# Patient Record
Sex: Female | Born: 1985 | Race: Black or African American | Hispanic: No | Marital: Single | Smoking: Never smoker
Health system: Southern US, Community
[De-identification: ages and names within clinical notes are randomized; demographics above are authoritative.]

## PROBLEM LIST (undated history)

## (undated) DIAGNOSIS — I10 Essential (primary) hypertension: Secondary | ICD-10-CM

## (undated) DIAGNOSIS — J45909 Unspecified asthma, uncomplicated: Secondary | ICD-10-CM

---

## 2013-01-01 ENCOUNTER — Emergency Department (HOSPITAL_COMMUNITY)
Admission: EM | Admit: 2013-01-01 | Discharge: 2013-01-02 | Disposition: A | Discharge status: Home / Self Care | Payer: 59 | Attending: Emergency Medicine | Admitting: Emergency Medicine

## 2013-01-01 ENCOUNTER — Encounter (HOSPITAL_COMMUNITY): Payer: Self-pay | Admitting: *Deleted

## 2013-01-01 DIAGNOSIS — Z79899 Other long term (current) drug therapy: Secondary | ICD-10-CM | POA: Insufficient documentation

## 2013-01-01 DIAGNOSIS — J111 Influenza due to unidentified influenza virus with other respiratory manifestations: Secondary | ICD-10-CM

## 2013-01-01 DIAGNOSIS — IMO0001 Reserved for inherently not codable concepts without codable children: Secondary | ICD-10-CM | POA: Insufficient documentation

## 2013-01-01 DIAGNOSIS — J45909 Unspecified asthma, uncomplicated: Secondary | ICD-10-CM | POA: Insufficient documentation

## 2013-01-01 DIAGNOSIS — R5381 Other malaise: Secondary | ICD-10-CM | POA: Insufficient documentation

## 2013-01-01 DIAGNOSIS — R110 Nausea: Secondary | ICD-10-CM | POA: Insufficient documentation

## 2013-01-01 DIAGNOSIS — Z3202 Encounter for pregnancy test, result negative: Secondary | ICD-10-CM | POA: Insufficient documentation

## 2013-01-01 DIAGNOSIS — R51 Headache: Secondary | ICD-10-CM | POA: Insufficient documentation

## 2013-01-01 DIAGNOSIS — R05 Cough: Secondary | ICD-10-CM | POA: Insufficient documentation

## 2013-01-01 DIAGNOSIS — R059 Cough, unspecified: Secondary | ICD-10-CM | POA: Insufficient documentation

## 2013-01-01 DIAGNOSIS — J3489 Other specified disorders of nose and nasal sinuses: Secondary | ICD-10-CM | POA: Insufficient documentation

## 2013-01-01 DIAGNOSIS — I10 Essential (primary) hypertension: Secondary | ICD-10-CM | POA: Insufficient documentation

## 2013-01-01 DIAGNOSIS — J029 Acute pharyngitis, unspecified: Secondary | ICD-10-CM | POA: Insufficient documentation

## 2013-01-01 HISTORY — DX: Unspecified asthma, uncomplicated: J45.909

## 2013-01-01 HISTORY — DX: Essential (primary) hypertension: I10

## 2013-01-01 MED ORDER — ACETAMINOPHEN 325 MG PO TABS
650.0000 mg | ORAL_TABLET | Freq: Once | ORAL | Status: AC
  Administered 2013-01-01: 650 mg via ORAL
  Filled 2013-01-01: qty 2

## 2013-01-01 NOTE — ED Notes (Signed)
Pt c/o cough, sore throat, fever and headache starting this morning.

## 2013-01-02 LAB — POCT PREGNANCY, URINE: Preg Test, Ur: NEGATIVE

## 2013-01-02 MED ORDER — OSELTAMIVIR PHOSPHATE 75 MG PO CAPS: 75.0000 mg | ORAL_CAPSULE | Freq: Two times a day (BID) | ORAL | Status: DC

## 2013-01-02 MED ORDER — IBUPROFEN 800 MG PO TABS
800.0000 mg | ORAL_TABLET | Freq: Once | ORAL | Status: AC
  Administered 2013-01-02: 800 mg via ORAL
  Filled 2013-01-02: qty 1

## 2013-01-02 NOTE — ED Provider Notes (Signed)
Medical screening examination/treatment/procedure(s) were performed by non-physician practitioner and as supervising physician I was immediately available for consultation/collaboration.   Ariyon Gerstenberger M Imri Lor, MD 01/02/13 0427 

## 2013-01-02 NOTE — ED Provider Notes (Signed)
History     CSN: [id number]  Arrival date & time 01/01/13  2237   First MD Initiated Contact with Patient 01/02/13 0015      Chief Complaint  Patient presents with  . Fever  . Influenza   HPI  History provided by the patient. Patient is a 27 year old female with history of asthma and hypertension who presents with complaints of fever, chills, cough, sore throat and body aches. Symptoms first began this morning with slight headache and body aches symptoms. She is increasing cough throughout the day that is dry. Patient did take a dose of Tylenol at 2 PM. She is not using other treatments for symptoms. Denies any other aggravating or alleviating factors. Denies any other associated symptoms. Denies any vomiting or diarrhea.    Past Medical History  Diagnosis Date  . Asthma   . Hypertension     History reviewed. No pertinent past surgical history.  History reviewed. No pertinent family history.  History  Substance Use Topics  . Smoking status: Never Smoker   . Smokeless tobacco: Not on file  . Alcohol Use: Yes     Comment: occasionally    OB History    Grav Para Term Preterm Abortions TAB SAB Ect Mult Living                  Review of Systems  Constitutional: Positive for fever, chills, appetite change and fatigue.  HENT: Positive for sore throat and rhinorrhea. Negative for congestion and neck pain.   Respiratory: Positive for cough. Negative for shortness of breath.   Cardiovascular: Negative for chest pain.  Gastrointestinal: Positive for nausea. Negative for vomiting, abdominal pain, diarrhea and constipation.  Musculoskeletal: Positive for myalgias.  Neurological: Positive for headaches.  All other systems reviewed and are negative.    Allergies  Prednisone  Home Medications   Current Outpatient Rx  Name  Route  Sig  Dispense  Refill  . ALBUTEROL SULFATE HFA 108 (90 BASE) MCG/ACT IN AERS   Inhalation   Inhale 2 puffs into the lungs every 6 (six) hours  as needed.         . ETHYNODIOL DIAC-ETH ESTRADIOL 1-35 MG-MCG PO TABS   Oral   Take 1 tablet by mouth daily.         Marland Kitchen METOPROLOL SUCCINATE ER 100 MG PO TB24   Oral   Take 100 mg by mouth daily. Take with or immediately following a meal.         . NIFEDIPINE ER OSMOTIC 60 MG PO TB24   Oral   Take 60 mg by mouth 2 (two) times daily.         . TRIAMTERENE-HCTZ 75-50 MG PO TABS   Oral   Take 1 tablet by mouth daily.           BP 151/74  Pulse 116  Temp 101.4 F (38.6 C) (Oral)  Resp 20  SpO2 98%  LMP 12/08/2012  Physical Exam  Nursing note and vitals reviewed. Constitutional: She is oriented to person, place, and time. She appears well-developed and well-nourished. No distress.  HENT:  Head: Normocephalic.  Right Ear: Tympanic membrane normal.  Left Ear: Tympanic membrane normal.  Mouth/Throat: Oropharynx is clear and moist.  Neck: Normal range of motion. Neck supple.       No meningeal signs  Cardiovascular: Regular rhythm.  Tachycardia present.        Mild tachycardia  Pulmonary/Chest: Effort normal and breath sounds normal. No respiratory  distress. She has no wheezes. She has no rales. She exhibits no tenderness.  Abdominal: Soft. There is no tenderness. There is no rebound and no guarding.  Neurological: She is alert and oriented to person, place, and time.  Skin: Skin is warm and dry.  Psychiatric: She has a normal mood and affect. Her behavior is normal.    ED Course  Procedures   Labs Reviewed  POCT PREGNANCY, URINE     1. Influenza       MDM  Patient seen and evaluated. Patient with symptoms consistent with influenza. Symptoms began within the last 24 hours. Will give prescription for Tamiflu.  Patient slightly tachycardic. She refused IV fluids. Tachycardia most likely secondary to fever and infection. Symptoms are typical for her flu. Patient otherwise appears well and is stable for discharge home.      Angus Seller,  PA 01/02/13 0330

## 2014-08-07 ENCOUNTER — Other Ambulatory Visit: Payer: Self-pay | Admitting: Ophthalmology

## 2014-08-07 ENCOUNTER — Ambulatory Visit
Admission: RE | Admit: 2014-08-07 | Discharge: 2014-08-07 | Disposition: A | Discharge status: Home / Self Care | Payer: 59 | Source: Ambulatory Visit | Attending: Ophthalmology | Admitting: Ophthalmology

## 2014-08-07 DIAGNOSIS — H543 Unqualified visual loss, both eyes: Secondary | ICD-10-CM

## 2014-08-07 DIAGNOSIS — R51 Headache: Secondary | ICD-10-CM

## 2014-08-07 MED ORDER — IOHEXOL 300 MG/ML  SOLN
75.0000 mL | Freq: Once | INTRAMUSCULAR | Status: AC | PRN
  Administered 2014-08-07: 75 mL via INTRAVENOUS

## 2014-09-07 ENCOUNTER — Ambulatory Visit (INDEPENDENT_AMBULATORY_CARE_PROVIDER_SITE_OTHER): Payer: 59 | Admitting: Family Medicine

## 2014-09-07 ENCOUNTER — Encounter: Payer: Self-pay | Admitting: Family Medicine

## 2014-09-07 VITALS — BP 154/80 | HR 103 | Temp 99.1°F | Resp 16 | Ht 64.25 in | Wt 258.4 lb

## 2014-09-07 DIAGNOSIS — Z1321 Encounter for screening for nutritional disorder: Secondary | ICD-10-CM

## 2014-09-07 DIAGNOSIS — Z23 Encounter for immunization: Secondary | ICD-10-CM

## 2014-09-07 DIAGNOSIS — E669 Obesity, unspecified: Secondary | ICD-10-CM

## 2014-09-07 DIAGNOSIS — R51 Headache: Secondary | ICD-10-CM

## 2014-09-07 DIAGNOSIS — I10 Essential (primary) hypertension: Secondary | ICD-10-CM

## 2014-09-07 DIAGNOSIS — R0989 Other specified symptoms and signs involving the circulatory and respiratory systems: Secondary | ICD-10-CM

## 2014-09-07 DIAGNOSIS — Z130 Encounter for screening for diseases of the blood and blood-forming organs and certain disorders involving the immune mechanism: Secondary | ICD-10-CM

## 2014-09-07 DIAGNOSIS — R0609 Other forms of dyspnea: Secondary | ICD-10-CM

## 2014-09-07 DIAGNOSIS — Z13228 Encounter for screening for other metabolic disorders: Secondary | ICD-10-CM

## 2014-09-07 DIAGNOSIS — R0683 Snoring: Secondary | ICD-10-CM

## 2014-09-07 DIAGNOSIS — Z1329 Encounter for screening for other suspected endocrine disorder: Secondary | ICD-10-CM

## 2014-09-07 LAB — POCT GLYCOSYLATED HEMOGLOBIN (HGB A1C): HEMOGLOBIN A1C: 5.7

## 2014-09-07 LAB — GLUCOSE, POCT (MANUAL RESULT ENTRY): POC GLUCOSE: 122 mg/dL — AB (ref 70–99)

## 2014-09-07 NOTE — Progress Notes (Signed)
Subjective:    Patient ID: April Lynch, female    DOB: [birth year], 28 y.o.   MRN: [id number]  HPI  This 28 y.o. AA female is here to establish care. 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 She has had full vision evaluation in [place], [place] and a small abnormality at optic nerve was seen on fundoscopic exam. Pt was referred to  Dr. Frederico Hamman who ordered a CT Brain (normal- report reviewed and is in [facility]). Pt has been advised to see a neurologist, Dr. Michel Santee, and she requests referral.   Pt reports daily HAs for years. No relief w/ APAP and temporary relief w/ Excedrin. HA is located behind the eyes and is accompanied by nausea and mild vision disturbance. Pt is taking OCP (since age 73 for heavy menses) but thinks HA predates this medication. Pt states she is not currently sexually active. Family hx significant for younger sister having migraines.  Pt also has HTN, controlled on current medications. She has been evaluated by Cardiology while living in [place], [place].   Prior to Admission medications   Medication Sig Start Date End Date Taking? Authorizing Provider  albuterol (PROVENTIL HFA;VENTOLIN HFA) 108 (90 BASE) MCG/ACT inhaler Inhale 2 puffs into the lungs every 6 (six) hours as needed.   Yes Historical Provider, MD  ethynodiol-ethinyl estradiol Celso Sickle) 1-35 MG-MCG tablet Take 1 tablet by mouth daily.   Yes Historical Provider, MD  metoprolol succinate (TOPROL-XL) 100 MG 24 hr tablet Take 100 mg by mouth daily. Take with or immediately following a meal.   Yes Historical Provider, MD  NIFEdipine (PROCARDIA XL/ADALAT-CC) 90 MG 24 hr tablet Take 90 mg by mouth daily.   Yes Historical Provider, MD  triamterene-hydrochlorothiazide (MAXZIDE) 75-50 MG per tablet Take by mouth daily. Take 1/2 tablet by mouth daily.   Yes Historical Provider, MD    History   Social History  . Marital Status: Single    Spouse Name: N/A    Number of Children:  N/A  . Years of Education: N/A   Occupational History  . Not on file.   Social History Main Topics  . Smoking status: Never Smoker   . Smokeless tobacco: Not on file  . Alcohol Use: Yes     Comment: occasionally  . Drug Use: No  . Sexual Activity: Yes    Birth Control/ Protection: Pill   Other Topics Concern  . Not on file   Social History Narrative  . [employer] graduate w/ degree in chemistry; has returned to school at [employer] to pursue other studies.    Review of Systems  Constitutional: Negative.   Eyes: Positive for visual disturbance.  Respiratory: Negative for cough, chest tightness and shortness of breath.   Cardiovascular: Negative.   Gastrointestinal: Negative.   Endocrine: Negative.   Genitourinary: Positive for menstrual problem.  Musculoskeletal: Negative.   Skin: Negative.   Neurological: Positive for headaches. Negative for dizziness, tremors, seizures, syncope, facial asymmetry, speech difficulty, weakness and numbness.  Psychiatric/Behavioral: Positive for sleep disturbance.       Pt has been told she snores; her father has sleep apnea/on CPAP.       Objective:   Physical Exam  Nursing note and vitals reviewed. Constitutional: She is oriented to person, place, and time. Vital signs are normal. She appears well-developed and well-nourished. No distress.  HENT:  Head: Normocephalic and atraumatic.  Right Ear: Hearing, tympanic membrane, external ear and ear canal normal.  Left Ear: Hearing, tympanic  membrane, external ear and ear canal normal.  Nose: Nose normal. No nasal deformity or septal deviation.  Mouth/Throat: Uvula is midline, oropharynx is clear and moist and mucous membranes are normal. No oral lesions. Normal dentition. No dental caries.  Eyes: Conjunctivae, EOM and lids are normal. Pupils are equal, round, and reactive to light. No scleral icterus.  Neck: Trachea normal, normal range of motion, full passive range of motion without pain and phonation  normal. Neck supple. No spinous process tenderness and no muscular tenderness present. No mass and no thyromegaly present.  Cardiovascular: Normal rate, regular rhythm, S1 normal, S2 normal and normal pulses.   No extrasystoles are present. Exam reveals no gallop and no friction rub.   No murmur heard. Pulmonary/Chest: Effort normal and breath sounds normal. No respiratory distress. She has no decreased breath sounds. She has no wheezes.  Musculoskeletal:       Cervical back: Normal.       Thoracic back: Normal.       Lumbar back: Normal.  Major joints and muscle groups are unremarkble.  Lymphadenopathy:       Head (right side): No submental, no submandibular, no tonsillar, no preauricular, no posterior auricular and no occipital adenopathy present.       Head (left side): No submental, no submandibular, no tonsillar, no preauricular, no posterior auricular and no occipital adenopathy present.    She has no cervical adenopathy.       Right: No supraclavicular adenopathy present.       Left: No supraclavicular adenopathy present.  Neurological: She is alert and oriented to person, place, and time. She has normal strength and normal reflexes. She displays no atrophy and no tremor. No cranial nerve deficit or sensory deficit. She exhibits normal muscle tone. Coordination and gait normal.  Skin: Skin is warm, dry and intact. No ecchymosis and no rash noted. She is not diaphoretic. No cyanosis or erythema. Nails show no clubbing.  Nose has prominent allergic crease; infraorbital shadows/ venous congestion are present.  Psychiatric: She has a normal mood and affect. Her speech is normal and behavior is normal. Judgment and thought content normal. Cognition and memory are normal.    Results for orders placed in visit on 09/07/14  GLUCOSE, POCT (MANUAL RESULT ENTRY)      Result Value Ref Range   POC Glucose 122 (*) 70 - 99 mg/dl  POCT GLYCOSYLATED HEMOGLOBIN (HGB A1C)      Result Value Ref Range    Hemoglobin A1C 5.7         Assessment & Plan:  FYBOFBPZ(025.8) - Refer to Dr. Michel Santee. Advised to discontinue OCPs until evaluation is complete. Plan: POCT glucose (manual entry), POCT glycosylated hemoglobin (Hb A1C), CBC with Differential  Essential hypertension - Continue current medications (Nifedipine 90 mg 1 tablet daily, Metoprolol succinate 100 mg 1 tablet daily and Triam- HCTZ 75-50  1/2 tablet daily). Encouraged weight reduction and healthy nutrition. Plan: Lipid panel, COMPLETE METABOLIC PANEL WITH GFR, Thyroid Panel With TSH  Snores - Briefly discussed Sleep Study as part of HA work-up. Plan: COMPLETE METABOLIC PANEL WITH GFR  Obesity, unspecified - Refer to Nutritionist for counseling. Plan: Lipid panel, Thyroid Panel With TSH  Encounter for vitamin deficiency screening - Plan: Vit D  25 hydroxy  Need for prophylactic vaccination and inoculation against influenza - Plan: Flu Vaccine QUAD 36+ mos IM

## 2014-09-07 NOTE — Patient Instructions (Signed)
Your blood test shows that you do not have Diabetes. I have ordered a referral to Dr. Catalina Gravel per your request. Also you will be scheduled with a nutrition specialist.  I suggest you stop taking the oral contraceptive until your headache disorder has been evaluated. This may help lower your blood pressure as well.    I will see you again in 6 weeks. You will be notified about your labs next week.  We may have to consider a referral for sleep study to rule out sleep apnea.

## 2014-09-08 LAB — COMPLETE METABOLIC PANEL WITH GFR
ALBUMIN: 4 g/dL (ref 3.5–5.2)
ALK PHOS: 63 U/L (ref 39–117)
ALT: 20 U/L (ref 0–35)
AST: 12 U/L (ref 0–37)
BILIRUBIN TOTAL: 0.3 mg/dL (ref 0.2–1.2)
BUN: 16 mg/dL (ref 6–23)
CO2: 22 mEq/L (ref 19–32)
Calcium: 9.5 mg/dL (ref 8.4–10.5)
Chloride: 104 mEq/L (ref 96–112)
Creat: 0.66 mg/dL (ref 0.50–1.10)
GFR, Est African American: >89 mL/min
GLUCOSE: 100 mg/dL — AB (ref 70–99)
Potassium: 3.9 mEq/L (ref 3.5–5.3)
Sodium: 138 mEq/L (ref 135–145)
Total Protein: 8.2 g/dL (ref 6.0–8.3)

## 2014-09-08 LAB — CBC WITH DIFFERENTIAL/PLATELET
Basophils Absolute: 0 10*3/uL (ref 0.0–0.1)
Basophils Relative: 0 % (ref 0–1)
Eosinophils Absolute: 0.2 10*3/uL (ref 0.0–0.7)
Eosinophils Relative: 2 % (ref 0–5)
HCT: 42.5 % (ref 36.0–46.0)
HEMOGLOBIN: 14 g/dL (ref 12.0–15.0)
LYMPHS ABS: 3.9 10*3/uL (ref 0.7–4.0)
Lymphocytes Relative: 36 % (ref 12–46)
MCH: 25.2 pg — AB (ref 26.0–34.0)
MCHC: 32.9 g/dL (ref 30.0–36.0)
MCV: 76.4 fL — ABNORMAL LOW (ref 78.0–100.0)
MONOS PCT: 5 % (ref 3–12)
Monocytes Absolute: 0.5 10*3/uL (ref 0.1–1.0)
NEUTROS ABS: 6.1 10*3/uL (ref 1.7–7.7)
NEUTROS PCT: 57 % (ref 43–77)
Platelets: 437 10*3/uL — ABNORMAL HIGH (ref 150–400)
RBC: 5.56 MIL/uL — AB (ref 3.87–5.11)
RDW: 15.1 % (ref 11.5–15.5)
WBC: 10.7 10*3/uL — ABNORMAL HIGH (ref 4.0–10.5)

## 2014-09-08 LAB — LIPID PANEL
CHOL/HDL RATIO: 4.2 ratio
CHOLESTEROL: 182 mg/dL (ref 0–200)
HDL: 43 mg/dL (ref >39)
LDL Cholesterol: 110 mg/dL — ABNORMAL HIGH (ref 0–99)
Triglycerides: 147 mg/dL (ref <150)
VLDL: 29 mg/dL (ref 0–40)

## 2014-09-08 LAB — THYROID PANEL WITH TSH
FREE THYROXINE INDEX: 2.4 (ref 1.4–3.8)
T3 Uptake: 26 % (ref 22.0–35.0)
T4 TOTAL: 9.4 ug/dL (ref 4.5–12.0)
TSH: 0.621 u[IU]/mL (ref 0.350–4.500)

## 2014-09-08 LAB — VITAMIN D 25 HYDROXY (VIT D DEFICIENCY, FRACTURES): VIT D 25 HYDROXY: 11 ng/mL — AB (ref 30–89)

## 2014-09-09 ENCOUNTER — Encounter: Payer: Self-pay | Admitting: Family Medicine

## 2014-09-09 DIAGNOSIS — E669 Obesity, unspecified: Secondary | ICD-10-CM | POA: Insufficient documentation

## 2014-09-09 DIAGNOSIS — R0683 Snoring: Secondary | ICD-10-CM | POA: Insufficient documentation

## 2014-09-09 DIAGNOSIS — R519 Headache, unspecified: Secondary | ICD-10-CM | POA: Insufficient documentation

## 2014-09-09 DIAGNOSIS — R51 Headache: Secondary | ICD-10-CM | POA: Insufficient documentation

## 2014-09-09 DIAGNOSIS — I10 Essential (primary) hypertension: Secondary | ICD-10-CM | POA: Insufficient documentation

## 2014-09-12 ENCOUNTER — Encounter: Payer: Self-pay | Admitting: Radiology

## 2014-09-12 ENCOUNTER — Other Ambulatory Visit: Payer: Self-pay | Admitting: Family Medicine

## 2014-09-12 MED ORDER — ERGOCALCIFEROL 1.25 MG (50000 UT) PO CAPS: 50000.0000 [IU] | ORAL_CAPSULE | ORAL | Status: DC

## 2014-09-12 NOTE — Progress Notes (Signed)
Quick Note:  Please advise pt regarding following labs... The main abnormality on your labs is a very low Vitamin D. I am prescribing a Vitamin D supplement- 50000 units; take 1 capsule once a week for the next 3-4 months.  Thyroid tests are normal. Blood counts are within normal limits. Metabolic profile (salts in the blood, blood sugar, kidney and liver functions) are normal.  Copy to pt. ______

## 2014-09-24 ENCOUNTER — Telehealth: Payer: Self-pay

## 2014-09-24 NOTE — Telephone Encounter (Signed)
Patient requesting our office to refer her to Dr. Catalina Gravel @ the headache[facility] pain clinic to evaluate her constants headaches [phone]). Patients call back number is [phone]

## 2014-09-25 NOTE — Telephone Encounter (Signed)
That referral has been done; it appears to be closed as of 09/07/2014. Look under Referrals in Chart Review for details about this referral. It looks like pt had appt on 09/24/2014 at 2:10 PM. Was she not aware of this? She may need to call the office to reschedule.

## 2014-09-26 NOTE — Telephone Encounter (Signed)
Pt states she went to the appt but they refused to see her since the referral had not been completed. Pt states that she will call back to schedule an appt with the office but we need to get pt information to Dr. Ovid Curd office. They will not accept an "electronic request"

## 2014-09-27 NOTE — Telephone Encounter (Signed)
Calling dr lewit office to see exactly what they need to have this patient scheduled

## 2014-10-02 ENCOUNTER — Ambulatory Visit (INDEPENDENT_AMBULATORY_CARE_PROVIDER_SITE_OTHER): Payer: 59 | Admitting: Physician Assistant

## 2014-10-02 VITALS — BP 160/92 | HR 74 | Temp 99.4°F | Resp 16 | Ht 64.5 in | Wt 257.0 lb

## 2014-10-02 DIAGNOSIS — Z111 Encounter for screening for respiratory tuberculosis: Secondary | ICD-10-CM

## 2014-10-02 NOTE — Progress Notes (Signed)
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  Needs 2 step PPD.

## 2014-10-02 NOTE — Progress Notes (Signed)
  Tuberculosis Risk Questionnaire  1. Yes [place]: Were you born outside the [place] in one of the following parts of the world: [place], [place], [place], [place] or [place]?    2. Yes  Have you traveled outside the [place] and lived for more than one month in one of the following parts of the world: [place], [place], [place], [place] or [place]?    3. No Do you have a compromised immune system such as from any of the following conditions:HIV/AIDS, organ or bone marrow transplantation, diabetes, immunosuppressive medicines (e.g. Prednisone, Remicaide), leukemia, lymphoma, cancer of the head or neck, gastrectomy or jejunal bypass, end-stage renal disease (on dialysis), or silicosis?     4. Yes Have you ever or do you plan on working in: a residential care center, a health care facility, a jail or prison or homeless shelter?    5. No Have you ever: injected illegal drugs, used crack cocaine, lived in a homeless shelter  or been in jail or prison?     6. No Have you ever been exposed to anyone with infectious tuberculosis?    Tuberculosis Symptom Questionnaire  Do you currently have any of the following symptoms?  1. No Unexplained cough lasting more than 3 weeks?   2. No Unexplained fever lasting more than 3 weeks.   3. No Night Sweats (sweating that leaves the bedclothes and sheets wet)     4. No Shortness of Breath   5. No Chest Pain   6. No Unintentional weight loss    7. No Unexplained fatigue (very tired for no reason)

## 2014-10-04 ENCOUNTER — Ambulatory Visit (INDEPENDENT_AMBULATORY_CARE_PROVIDER_SITE_OTHER): Payer: 59

## 2014-10-04 DIAGNOSIS — Z7689 Persons encountering health services in other specified circumstances: Secondary | ICD-10-CM

## 2014-10-04 DIAGNOSIS — Z111 Encounter for screening for respiratory tuberculosis: Secondary | ICD-10-CM

## 2014-10-04 LAB — TB SKIN TEST
Induration: 0 mm
TB Skin Test: NEGATIVE

## 2014-10-04 NOTE — Progress Notes (Signed)
   Subjective:    Patient ID: April Lynch , female    DOB: 22-Oct-1986, 28 y.o.   MRN: [id number]  HPI Patient is a 28 yo female who presents today for a TB reading. Her results were negative. Patient received a printed copy of her results.     Review of Systems     Objective:   Physical Exam        Assessment & Plan:

## 2014-10-09 ENCOUNTER — Ambulatory Visit (INDEPENDENT_AMBULATORY_CARE_PROVIDER_SITE_OTHER): Payer: 59 | Admitting: Family Medicine

## 2014-10-09 VITALS — BP 122/84 | HR 70 | Temp 98.9°F | Resp 18 | Ht 64.5 in | Wt 253.0 lb

## 2014-10-09 DIAGNOSIS — Z111 Encounter for screening for respiratory tuberculosis: Secondary | ICD-10-CM

## 2014-10-09 NOTE — Progress Notes (Signed)
   Subjective:    Patient ID: April Lynch, female    DOB: Mar 31, 1986, 28 y.o.   MRN: [id number] This chart was scribed for Delman Cheadle, MD by Zola Button, Medical Scribe. This patient was seen in Room 11 and the patient's care was started at 1:57 PM.   HPI HPI Comments: April Lynch is a 28 y.o. female who presents to the [facility] for her second PPD shot. She denies fever, chills, cough, SOB and wheezing. She is currently in school for surgery tech.  Past Medical History  Diagnosis Date  . Asthma   . Hypertension    Current Outpatient Prescriptions on File Prior to Visit  Medication Sig Dispense Refill  . albuterol (PROVENTIL HFA;VENTOLIN HFA) 108 (90 BASE) MCG/ACT inhaler Inhale 2 puffs into the lungs every 6 (six) hours as needed.      . ergocalciferol (DRISDOL) 50000 UNITS capsule Take 1 capsule (50,000 Units total) by mouth once a week.  4 capsule  5  . metoprolol succinate (TOPROL-XL) 100 MG 24 hr tablet Take 100 mg by mouth daily. Take with or immediately following a meal.      . NIFEdipine (PROCARDIA XL/ADALAT-CC) 90 MG 24 hr tablet Take 90 mg by mouth daily.      Marland Kitchen triamterene-hydrochlorothiazide (MAXZIDE) 75-50 MG per tablet Take by mouth daily. Take 1/2 tablet by mouth daily.       No current facility-administered medications on file prior to visit.   Allergies  Allergen Reactions  . Prednisone Hives and Swelling     Review of Systems  Constitutional: Negative for fever, chills, appetite change and fatigue.  HENT: Negative for congestion, ear discharge and sinus pressure.   Eyes: Negative for discharge.  Respiratory: Negative for cough, shortness of breath and wheezing.   Cardiovascular: Negative for chest pain.  Gastrointestinal: Negative for abdominal pain and diarrhea.  Genitourinary: Negative for frequency and hematuria.  Musculoskeletal: Negative for back pain.  Skin: Negative for rash.  Neurological: Negative for seizures and headaches.    Psychiatric/Behavioral: Negative for hallucinations.       Objective:  BP 122/84  Pulse 70  Temp(Src) 98.9 F (37.2 C) (Oral)  Resp 18  Ht 5' 4.5" (1.638 m)  Wt 253 lb (114.76 kg)  BMI 42.77 kg/m2  SpO2 98%  LMP 10/02/2014  Physical Exam  Nursing note and vitals reviewed. Constitutional: She is oriented to person, place, and time. She appears well-developed and well-nourished. No distress.  HENT:  Head: Normocephalic and atraumatic.  Mouth/Throat: Oropharynx is clear and moist. No oropharyngeal exudate.  Eyes: Pupils are equal, round, and reactive to light.  Neck: Neck supple.  Cardiovascular: Normal rate.   Pulmonary/Chest: Effort normal.  Musculoskeletal: She exhibits no edema.  Neurological: She is alert and oriented to person, place, and time. No cranial nerve deficit.  Skin: Skin is warm and dry. No rash noted.  Psychiatric: She has a normal mood and affect. Her behavior is normal.          Assessment & Plan:  Second PPD placed, to return in 48-72 hours for follow-up. Screening-pulmonary TB - Plan: TB Skin Test   I personally performed the services described in this documentation, which was scribed in my presence. The recorded information has been reviewed and considered, and addended by me as needed.  Delman Cheadle, MD MPH

## 2014-10-09 NOTE — Progress Notes (Signed)
  Tuberculosis Risk Questionnaire  1. Yes  Were you born outside the [place] in one of the following parts of the world: [place], [place], [place], [place] or [place]?    2. Yes  Have you traveled outside the [place] and lived for more than one month in one of the following parts of the world: [place], [place], [place], [place] or [place]?    3. No Do you have a compromised immune system such as from any of the following conditions:HIV/AIDS, organ or bone marrow transplantation, diabetes, immunosuppressive medicines (e.g. Prednisone, Remicaide), leukemia, lymphoma, cancer of the head or neck, gastrectomy or jejunal bypass, end-stage renal disease (on dialysis), or silicosis?     4. Yes  Have you ever or do you plan on working in: a residential care center, a health care facility, a jail or prison or homeless shelter?    5. No Have you ever: injected illegal drugs, used crack cocaine, lived in a homeless shelter  or been in jail or prison?     6. No Have you ever been exposed to anyone with infectious tuberculosis?    Tuberculosis Symptom Questionnaire  Do you currently have any of the following symptoms?  1. No Unexplained cough lasting more than 3 weeks?   2. No Unexplained fever lasting more than 3 weeks.   3. No Night Sweats (sweating that leaves the bedclothes and sheets wet)     4. No Shortness of Breath   5. No Chest Pain   6. No Unintentional weight loss    7. No Unexplained fatigue (very tired for no reason)

## 2014-10-11 ENCOUNTER — Ambulatory Visit (INDEPENDENT_AMBULATORY_CARE_PROVIDER_SITE_OTHER): Payer: 59 | Admitting: *Deleted

## 2014-10-11 DIAGNOSIS — Z111 Encounter for screening for respiratory tuberculosis: Secondary | ICD-10-CM

## 2014-10-11 DIAGNOSIS — Z7689 Persons encountering health services in other specified circumstances: Secondary | ICD-10-CM

## 2014-10-11 LAB — TB SKIN TEST
INDURATION: 0 mm
TB Skin Test: NEGATIVE

## 2014-10-19 ENCOUNTER — Other Ambulatory Visit: Payer: Self-pay | Admitting: Family Medicine

## 2014-10-19 ENCOUNTER — Ambulatory Visit (INDEPENDENT_AMBULATORY_CARE_PROVIDER_SITE_OTHER): Payer: 59 | Admitting: Family Medicine

## 2014-10-19 ENCOUNTER — Telehealth: Payer: Self-pay | Admitting: Radiology

## 2014-10-19 ENCOUNTER — Encounter: Payer: Self-pay | Admitting: Family Medicine

## 2014-10-19 VITALS — BP 120/80 | HR 85 | Temp 99.3°F | Resp 16 | Ht 64.0 in | Wt 255.0 lb

## 2014-10-19 DIAGNOSIS — N920 Excessive and frequent menstruation with regular cycle: Secondary | ICD-10-CM

## 2014-10-19 DIAGNOSIS — I10 Essential (primary) hypertension: Secondary | ICD-10-CM

## 2014-10-19 DIAGNOSIS — J309 Allergic rhinitis, unspecified: Secondary | ICD-10-CM

## 2014-10-19 MED ORDER — FEXOFENADINE HCL 180 MG PO TABS: 180.0000 mg | ORAL_TABLET | Freq: Every day | ORAL | Status: DC

## 2014-10-19 NOTE — Patient Instructions (Addendum)
You need to call Dr. Ovid Curd office to schedule your Neurology appointment for further evaluation of headaches.  I have ordered a pelvic ultrasound to evaluate four female reproductive organs. Because of your weight, they may need to do an intravaginal ultrasound (if you do not have a problem with that). If you would rather not have the intravaginal imaging study, they will try to get the best picture possible but it will be of low quality. I will contact you once I have the results of the scan.  I do not feel comfortable prescribing any other contraceptive until I have this information.  Continue taking current medications and work on weight loss. If you can get close to a normal weight, your menstrual cycle might become more normal.      Mediterranean Diet  Why follow it? Research shows.   Those who follow the Mediterranean diet have a reduced risk of heart disease    The diet is associated with a reduced incidence of Parkinson's and Alzheimer's diseases   People following the diet may have longer life expectancies and lower rates of chronic diseases    The Dietary Guidelines for Americans recommends the Mediterranean diet as an eating plan to promote health and prevent disease  What Is the Mediterranean Diet?    Healthy eating plan based on typical foods and recipes of Mediterranean-style cooking   The diet is primarily a plant based diet; these foods should make up a majority of meals   Starches - Plant based foods should make up a majority of meals - They are an important sources of vitamins, minerals, energy, antioxidants, and fiber - Choose whole grains, foods high in fiber and minimally processed items  - Typical grain sources include wheat, oats, barley, corn, brown rice, bulgar, farro, millet, polenta, couscous  - Various types of beans include chickpeas, lentils, fava beans, black beans, white beans   Fruits  Veggies - Large quantities of antioxidant rich fruits & veggies; 6 or more  servings  - Vegetables can be eaten raw or lightly drizzled with oil and cooked  - Vegetables common to the traditional Mediterranean Diet include: artichokes, arugula, beets, broccoli, brussel sprouts, cabbage, carrots, celery, collard greens, cucumbers, eggplant, kale, leeks, lemons, lettuce, mushrooms, okra, onions, peas, peppers, potatoes, pumpkin, radishes, rutabaga, shallots, spinach, sweet potatoes, turnips, zucchini - Fruits common to the Mediterranean Diet include: apples, apricots, avocados, cherries, clementines, dates, figs, grapefruits, grapes, melons, nectarines, oranges, peaches, pears, pomegranates, strawberries, tangerines  Fats - Replace butter and margarine with healthy oils, such as olive oil, canola oil, and tahini  - Limit nuts to no more than a handful a day  - Nuts include walnuts, almonds, pecans, pistachios, pine nuts  - Limit or avoid candied, honey roasted or heavily salted nuts - Olives are central to the [employer] - can be eaten whole or used in a variety of dishes   Meats Protein - Limiting red meat: no more than a few times a month - When eating red meat: choose lean cuts and keep the portion to the size of deck of cards - Eggs: approx. 0 to 4 times a week  - Fish and lean poultry: at least 2 a week  - Healthy protein sources include, chicken, [place], lean beef, lamb - Increase intake of seafood such as tuna, salmon, trout, mackerel, shrimp, scallops - Avoid or limit high fat processed meats such as sausage and bacon  Dairy - Include moderate amounts of low fat dairy products  -  Focus on healthy dairy such as fat free yogurt, skim milk, low or reduced fat cheese - Limit dairy products higher in fat such as whole or 2% milk, cheese, ice cream  Alcohol - Moderate amounts of red wine is ok  - No more than 5 oz daily for women (all ages) and men older than age 9  - No more than 10 oz of wine daily for men younger than 63  Other - Limit sweets and other  desserts  - Use herbs and spices instead of salt to flavor foods  - Herbs and spices common to the traditional Mediterranean Diet include: basil, bay leaves, chives, cloves, cumin, fennel, garlic, lavender, marjoram, mint, oregano, parsley, pepper, rosemary, sage, savory, sumac, tarragon, thyme   It's not just a diet, it's a lifestyle:    The Mediterranean diet includes lifestyle factors typical of those in the region    Foods, drinks and meals are best eaten with others and savored   Daily physical activity is important for overall good health   This could be strenuous exercise like running and aerobics   This could also be more leisurely activities such as walking, housework, yard-work, or taking the stairs   Moderation is the key; a balanced and healthy diet accommodates most foods and drinks   Consider portion sizes and frequency of consumption of certain foods   Meal Ideas & Options:    Breakfast:  o Whole wheat toast or whole wheat English muffins with peanut butter & hard boiled egg o Steel cut oats topped with apples & cinnamon and skim milk  o Fresh fruit: banana, strawberries, melon, berries, peaches  o Smoothies: strawberries, bananas, greek yogurt, peanut butter o Low fat greek yogurt with blueberries and granola  o Egg white omelet with spinach and mushrooms o Breakfast couscous: whole wheat couscous, apricots, skim milk, cranberries    Sandwiches:  o Hummus and grilled vegetables (peppers, zucchini, squash) on whole wheat bread   o Grilled chicken on whole wheat pita with lettuce, tomatoes, cucumbers or tzatziki  o Tuna salad on whole wheat bread: tuna salad made with greek yogurt, olives, red peppers, capers, green onions o Garlic rosemary lamb pita: lamb sauted with garlic, rosemary, salt & pepper; add lettuce, cucumber, greek yogurt to pita - flavor with lemon juice and black pepper    Seafood:  o Mediterranean grilled salmon, seasoned with garlic, basil, parsley, lemon  juice and black pepper o Shrimp, lemon, and spinach whole-grain pasta salad made with low fat greek yogurt  o Seared scallops with lemon orzo  o Seared tuna steaks seasoned salt, pepper, coriander topped with tomato mixture of olives, tomatoes, olive oil, minced garlic, parsley, green onions and cappers    Meats:  o Herbed greek chicken salad with kalamata olives, cucumber, feta  o Red bell peppers stuffed with spinach, bulgur, lean ground beef (or lentils) & topped with feta   o Kebabs: skewers of chicken, tomatoes, onions, zucchini, squash  o [place] burgers: made with red onions, mint, dill, lemon juice, feta cheese topped with roasted red peppers   Vegetarian o Cucumber salad: cucumbers, artichoke hearts, celery, red onion, feta cheese, tossed in olive oil & lemon juice  o Hummus and whole grain pita points with a greek salad (lettuce, tomato, feta, olives, cucumbers, red onion) o Lentil soup with celery, carrots made with vegetable broth, garlic, salt and pepper  o Tabouli salad: parsley, bulgur, mint, scallions, cucumbers, tomato, radishes, lemon juice, olive oil, salt and  pepper. o

## 2014-10-19 NOTE — Telephone Encounter (Signed)
Message copied by Candice Camp on Fri Oct 19, 2014  3:45 PM ------      Message from: Barton Fanny      Created: Fri Oct 19, 2014 12:33 PM       Please let pt know that I prescribed a different allergy medication (generic Allegra 180 mg  1 tablet every evening). It was routed to her pharmacy; I am not sure of the co-pay for this medication since it is available OTC. If it costs more than she wants to pay, she can let us know. She could ask the pharmacist what her insurance will cover; that information would help [place] a lot.            Thank you. ------

## 2014-10-19 NOTE — Telephone Encounter (Signed)
Thanks called her to advise/ she voiced understanding.

## 2014-10-22 ENCOUNTER — Encounter: Payer: Self-pay | Admitting: Dietician

## 2014-10-22 ENCOUNTER — Encounter: Payer: 59 | Attending: Family Medicine | Admitting: Dietician

## 2014-10-22 VITALS — Ht 64.0 in | Wt 258.3 lb

## 2014-10-22 DIAGNOSIS — E669 Obesity, unspecified: Secondary | ICD-10-CM | POA: Diagnosis not present

## 2014-10-22 DIAGNOSIS — Z713 Dietary counseling and surveillance: Secondary | ICD-10-CM | POA: Insufficient documentation

## 2014-10-22 DIAGNOSIS — Z6841 Body Mass Index (BMI) 40.0 and over, adult: Secondary | ICD-10-CM | POA: Insufficient documentation

## 2014-10-22 NOTE — Patient Instructions (Addendum)
Try to eat 3 times a day and snacks if you are hungry.  In the late morning, try to have some eat (choose protein and carb from the snack sheet) or a protein shake (protein shake). Use small plates for meals. Fill half of your plate with vegetables and have protein the size of the palm of your hand. Drink mostly water or other no calorie beverages. Think about keeping cut up vegetables and hummus around for snacks/meal. Aim to exercise 3-4 x week.

## 2014-10-22 NOTE — Progress Notes (Signed)
  Medical Nutrition Therapy:  Appt start time: 1430 end time:  7124.   Assessment:  Primary concerns today: April Lynch is here today since she has struggled with her weight since she was younger and her doctor is concerned. States that she does not eat badly. Has gained about 30 lbs in the past 2 years. Has a lot of stress since she is in school for surgical technology and already has a degree in chemistry. 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   Lives by herself. Eats about 1-2 x day. Does not eat out much. Has been eating the same way for awhile. Has tried some diets in the past that don't normally last too long. Feels like she gets sick if she eats in the morning. Will vomit if she tries to eat or drink in the morning.   Has been trying to avoid salt since she has blood pressure but she has trouble doing that.  Gets about 6-7 hours of sleep per night.    Preferred Learning Style:   No preference indicated   Learning Readiness:   Contemplating  MEDICATIONS: see list   DIETARY INTAKE:  Usual eating pattern includes 1-2 meals and 0snacks per day.  Avoided foods include: fruit, seafood, peanut butter, yogurt, popcorn, "sweets"   24-hr recall:  B ( AM): none Snk ( AM): none  L (3 PM): leftovers such as chicken with vegetables and water or juice Snk ( PM): none D (8-9 PM): chicken with vegetables  Snk ( PM): none Beverages: water, juice, soda at work sometimes  Usual physical activity: sometimes will go to the gym (1 x week) and will work out for 60 minutes (elliptical, treadmill, and weights)  Estimated energy needs: 2000 calories 225 g carbohydrates 150 g protein 56 g fat  Progress Towards Goal(s):  In progress.   Nutritional Diagnosis:  [place]-3.3 Overweight/obesity As related to hx of meal skipping and large portion sizes.  As evidenced by BMI of 44.3.    Intervention:  Nutrition counseling provided. Plan: Try to eat 3 times a day and snacks if you are  hungry.  In the late morning, try to have some eat (choose protein and carb from the snack sheet) or a protein shake (protein shake). Use small plates for meals. Fill half of your plate with vegetables and have protein the size of the palm of your hand. Drink mostly water or other no calorie beverages. Think about keeping cut up vegetables and hummus around for snacks/meal. Aim to exercise 3-4 x week.  Teaching Method Utilized:  Visual Auditory Hands on  Handouts given during visit include:  MyPlate Handout  15 g CHO Snacks  Barriers to learning/adherence to lifestyle change: fees sick if she eats in the morning and doesn't like a lot of foods  Demonstrated degree of understanding via:  Teach Back   Monitoring/Evaluation:  Dietary intake, exercise, and body weight in 2 month(s).

## 2014-10-23 NOTE — Progress Notes (Signed)
Subjective:    Patient ID: April Lynch, female    DOB: [birth year], 28 y.o.   MRN: [id number]  Headache  Pertinent negatives include no ear pain, eye redness or sinus pressure.    This 28 y.o. AA female is here for follow-up re: abnormal menses; she has hx of heavy menstrual bleeding which was controlled w/ OCPs. Pt advised by me to discontinue OCPs due to headache disorder as well as elevated BP. Since stopping that medication, BP is in normal range and she has experienced less HAs. Pt is awaiting appt w/ neurologist, Dr. Catalina Gravel foe full evaluation of headaches, which are c/w a migraine variant; CT Head in August 2015 was normal.  Regarding abnormal menses- pt desires some other oral agent to control her bleeding. She declines Depo injection or IUD.  She has not had pelvic ultrasound as part of evaluation for menorrhagia.  Pt has sinus congestion, mild sore throat, rhinorrhea and hx of seasonal allergies, treated w/ OTC Claritin. This medication has been minimally effective. She denies fever/chills, fatigue, sneezing, chronic cough, SOB, chest tightness, HA or dizziness.   Patient Active Problem List   Diagnosis Date Noted  . Allergic rhinitis 10/19/2014  . Menorrhagia with regular cycle 10/19/2014  . Headache(784.0) 09/09/2014  . Essential hypertension 09/09/2014  . Snores 09/09/2014  . Obesity, unspecified 09/09/2014    Prior to Admission medications   Medication Sig Start Date End Date Taking? Authorizing Provider  albuterol (PROVENTIL HFA;VENTOLIN HFA) 108 (90 BASE) MCG/ACT inhaler Inhale 2 puffs into the lungs every 6 (six) hours as needed.   Yes Historical Provider, MD  ergocalciferol (DRISDOL) 50000 UNITS capsule Take 1 capsule (50,000 Units total) by mouth once a week. 09/12/14 09/12/15 Yes Barton Fanny, MD  metoprolol succinate (TOPROL-XL) 100 MG 24 hr tablet Take 100 mg by mouth daily. Take with or immediately following a meal.   Yes Historical Provider, MD  NIFEdipine  (PROCARDIA XL/ADALAT-CC) 90 MG 24 hr tablet Take 90 mg by mouth daily.   Yes Historical Provider, MD  triamterene-hydrochlorothiazide (MAXZIDE) 75-50 MG per tablet Take by mouth daily. Take 1/2 tablet by mouth daily.   Yes Historical Provider, MD  fexofenadine (ALLEGRA) 180 MG tablet Take 1 tablet (180 mg total) by mouth daily. 10/19/14   Barton Fanny, MD    History   Social History  . Marital Status: Single    Spouse Name: N/A    Number of Children: N/A  . Years of Education: N/A   Occupational History  . Not on file.   Social History Main Topics  . Smoking status: Never Smoker   . Smokeless tobacco: Not on file  . Alcohol Use: Yes     Comment: occasionally  . Drug Use: No  . Sexual Activity: Yes    Birth Control/ Protection: Pill   Other Topics Concern  . Not on file   Social History Narrative  . No narrative on file    Review of Systems  Constitutional: Negative.   HENT: Negative for ear pain, sinus pressure, sneezing and trouble swallowing.   Eyes: Negative for redness and itching.  Respiratory: Negative.   Cardiovascular: Negative.   Skin: Negative.   Neurological: Positive for headaches.      Objective:   Physical Exam  Nursing note and vitals reviewed. Constitutional: She is oriented to person, place, and time. She appears well-developed and well-nourished. No distress.  HENT:  Head: Normocephalic and atraumatic.  Right Ear: External ear normal.  Left Ear:  External ear normal.  Nose: Nose normal.  Mouth/Throat: Oropharynx is clear and moist.  Eyes: Conjunctivae and EOM are normal. Pupils are equal, round, and reactive to light. No scleral icterus.  Neck: Normal range of motion. Neck supple. No thyromegaly present.  Cardiovascular: Normal rate, regular rhythm and normal heart sounds.  Exam reveals no gallop and no friction rub.   No murmur heard. Pulmonary/Chest: Effort normal and breath sounds normal. No respiratory distress. She has no wheezes.    Musculoskeletal: Normal range of motion. She exhibits no edema and no tenderness.       Cervical back: Normal.       Thoracic back: Normal.  Lymphadenopathy:    She has no cervical adenopathy.  Neurological: She is alert and oriented to person, place, and time. She has normal reflexes. No cranial nerve deficit. She exhibits normal muscle tone. Coordination normal.  Skin: Skin is warm and dry. No rash noted. She is not diaphoretic. No erythema.  Psychiatric: She has a normal mood and affect. Her behavior is normal. Judgment and thought content normal.       Assessment & Plan:  Menorrhagia with regular cycle - Advised that she remain off OCPs for now and she should have evaluation re: anatomy. Plan: US Pelvis Complete  Essential hypertension- Stable and controlled on current medications.  Allergic rhinitis, unspecified allergic rhinitis type- Trial generic Allegra.  Meds ordered this encounter  Medications  . fexofenadine (ALLEGRA) 180 MG tablet    Sig: Take 1 tablet (180 mg total) by mouth daily.    Dispense:  30 tablet    Refill:  5

## 2014-10-30 ENCOUNTER — Ambulatory Visit
Admission: RE | Admit: 2014-10-30 | Discharge: 2014-10-30 | Disposition: A | Discharge status: Home / Self Care | Payer: 59 | Source: Ambulatory Visit | Attending: Family Medicine | Admitting: Family Medicine

## 2014-10-30 DIAGNOSIS — N920 Excessive and frequent menstruation with regular cycle: Secondary | ICD-10-CM

## 2014-11-01 ENCOUNTER — Telehealth: Payer: Self-pay | Admitting: Family Medicine

## 2014-11-01 DIAGNOSIS — D259 Leiomyoma of uterus, unspecified: Secondary | ICD-10-CM

## 2014-11-01 DIAGNOSIS — N920 Excessive and frequent menstruation with regular cycle: Secondary | ICD-10-CM

## 2014-11-01 NOTE — Telephone Encounter (Signed)
I phoned pt to advise her of prlvic US findings- multiple uterine fibroids. Pt has appt w/ Dr. Ardath Sax (PQA) in [place], [place] on 11/12/2014. She wonders if she needs a referral from [place]; I advised her I would send a message to referral pool about this. I am going to order a referral so that her scheduled visit will be covered by her insurer [facility]). A copy of the [place] report will be at 104 front desk for her to pick up to take to GYN visit.

## 2014-12-17 ENCOUNTER — Ambulatory Visit: Payer: 59 | Admitting: Dietician

## 2015-04-11 ENCOUNTER — Ambulatory Visit (INDEPENDENT_AMBULATORY_CARE_PROVIDER_SITE_OTHER): Payer: 59 | Admitting: Family Medicine

## 2015-04-11 ENCOUNTER — Encounter: Payer: Self-pay | Admitting: Family Medicine

## 2015-04-11 VITALS — BP 138/73 | HR 73 | Temp 98.7°F | Resp 16 | Ht 64.0 in | Wt 261.8 lb

## 2015-04-11 DIAGNOSIS — E669 Obesity, unspecified: Secondary | ICD-10-CM | POA: Diagnosis not present

## 2015-04-11 DIAGNOSIS — E559 Vitamin D deficiency, unspecified: Secondary | ICD-10-CM

## 2015-04-11 DIAGNOSIS — I10 Essential (primary) hypertension: Secondary | ICD-10-CM | POA: Diagnosis not present

## 2015-04-11 DIAGNOSIS — D259 Leiomyoma of uterus, unspecified: Secondary | ICD-10-CM

## 2015-04-11 MED ORDER — ERGOCALCIFEROL 1.25 MG (50000 UT) PO CAPS: 50000.0000 [IU] | ORAL_CAPSULE | ORAL | Status: DC

## 2015-04-11 NOTE — Patient Instructions (Addendum)
Vitamin D Deficiency Vitamin D is an important vitamin that your body needs. Having too little of it in your body is called a deficiency. A very bad deficiency can make your bones soft and can cause a condition called rickets.  Vitamin D is important to your body for different reasons, such as:   It helps your body absorb 2 minerals called calcium and phosphorus.  It helps make your bones healthy.  It may prevent some diseases, such as diabetes and multiple sclerosis.  It helps your muscles and heart. You can get vitamin D in several ways. It is a natural part of some foods. The vitamin is also added to some dairy products and cereals. Some people take vitamin D supplements. Also, your body makes vitamin D when you are in the sun. It changes the sun's rays into a form of the vitamin that your body can use. CAUSES   Not eating enough foods that contain vitamin D.  Not getting enough sunlight.  Having certain digestive system diseases that make it hard to absorb vitamin D. These diseases include Crohn's disease, chronic pancreatitis, and cystic fibrosis.  Having a surgery in which part of the stomach or small intestine is removed.  Being obese. Fat cells pull vitamin D out of your blood. That means that obese people may not have enough vitamin D left in their blood and in other body tissues.  Having chronic kidney or liver disease. RISK FACTORS Risk factors are things that make you more likely to develop a vitamin D deficiency. They include:  Being older.  Not being able to get outside very much.  Living in a nursing home.  Having had broken bones.  Having weak or thin bones (osteoporosis).  Having a disease or condition that changes how your body absorbs vitamin D.  Having dark skin.  Some medicines such as seizure medicines or steroids.  Being overweight or obese. SYMPTOMS Mild cases of vitamin D deficiency may not have any symptoms. If you have a very bad case, symptoms  may include:  Bone pain.  Muscle pain.  Falling often.  Broken bones caused by a minor injury, due to osteoporosis. DIAGNOSIS A blood test is the best way to tell if you have a vitamin D deficiency. TREATMENT Vitamin D deficiency can be treated in different ways. Treatment for vitamin D deficiency depends on what is causing it. Options include:  Taking vitamin D supplements.  Taking a calcium supplement. Your caregiver will suggest what dose is best for you. HOME CARE INSTRUCTIONS  Take any supplements that your caregiver prescribes. Follow the directions carefully. Take only the suggested amount.  Have your blood tested 2 months after you start taking supplements.  Eat foods that contain vitamin D. Healthy choices include:  Fortified dairy products, cereals, or juices. Fortified means vitamin D has been added to the food. Check the label on the package to be sure.  Fatty fish like salmon or trout.  Eggs.  Oysters.  Do not use a tanning bed.  Keep your weight at a healthy level. Lose weight if you need to.  Keep all follow-up appointments. Your caregiver will need to perform blood tests to make sure your vitamin D deficiency is going away. SEEK MEDICAL CARE IF:  You have any questions about your treatment.  You continue to have symptoms of vitamin D deficiency.  You have nausea or vomiting.  You are constipated.  You feel confused.  You have severe abdominal or back pain. MAKE   SURE YOU:  Understand these instructions.  Will watch your condition.  Will get help right away if you are not doing well or get worse. Document Released: 03/07/2012 Document Revised: 04/10/2013 Document Reviewed: 03/07/2012 [facility] Patient Information 2015 [place], [place]. This information is not intended to replace advice given to you by your health care provider. Make sure you discuss any questions you have with your health care provider.   Your referral to your GYN physician in  [place] has been ordered.  Pick up the Vitamin D and take 1 capsule every week until you have no refills. Then, you will need to get an over-the-counter Vitamin D3  2000 units p[er capsule and take it daily. Try to get some sun exposure most days of the week so that your body can use the Vitamin D.   Take care and congratulations on your upcoming graduation. All the best to you!

## 2015-04-13 NOTE — Progress Notes (Signed)
   Subjective:    Patient ID: April Lynch, female    DOB: 05-16-1986, 29 y.o.   MRN: [id number]  HPI  This 29 y.o. Female is here for follow-up re: HTN and obesity. She recently started fitness plan but has not lost weight. She does state that her clothes fit differently. Current medication- triamterene- HCTZ 75-50 mg  1/2 tab daily is effective w/o adverse effects. She is not taking nifedipine 90 mg tab nor metoprolol succ 100 mg.  Pt has made some nutrition changes, limiting salt, caffeine and carbs. Pt is in school; she goes to [employer] at 4 AM most mornings for hour-long work-out.  Pt has uterine fibroids, followed by GYN in [place], [place]. Last OV w/ GYN ~ 3 months ago. Pt needs referral back to same specialist. Next appt will be to monitor growths (at least 4 masses) and to determine treatment. Goal is to preserve child-bearing capacity.  Pt has Vit D def (11 ng/mL in Sept 2015) but currently not taking Vit D supplement.    Patient Active Problem List   Diagnosis Date Noted  . Allergic rhinitis 10/19/2014  . Menorrhagia with regular cycle 10/19/2014  . Headache(784.0) 09/09/2014  . Essential hypertension 09/09/2014  . Snores 09/09/2014  . Obesity 09/09/2014    MEDICATIONS, SURG,SOC and FAM HX reviewed.   Review of Systems  Constitutional: Positive for activity change. Negative for fever, diaphoresis and fatigue.  Eyes: Negative for visual disturbance.  Respiratory: Negative for cough, chest tightness, shortness of breath and wheezing.   Cardiovascular: Negative.   Genitourinary: Positive for menstrual problem.  Musculoskeletal: Negative.   Neurological: Negative for dizziness, syncope, weakness, numbness and headaches.  Psychiatric/Behavioral: Negative.        Objective:   Physical Exam  Constitutional: She is oriented to person, place, and time. She appears well-developed and well-nourished. No distress.  HENT:  Head: Normocephalic and atraumatic.  Right Ear:  External ear normal.  Left Ear: External ear normal.  Nose: Nose normal.  Mouth/Throat: Oropharynx is clear and moist.  Eyes: Conjunctivae and EOM are normal. No scleral icterus.  Neck: Normal range of motion. Neck supple. No thyromegaly present.  Cardiovascular: Normal rate, regular rhythm and normal heart sounds.   No murmur heard. Pulmonary/Chest: Effort normal and breath sounds normal. No respiratory distress. She has no wheezes.  Musculoskeletal: Normal range of motion. She exhibits no edema or tenderness.  Neurological: She is alert and oriented to person, place, and time. No cranial nerve deficit. Coordination normal.  Skin: Skin is warm and dry. She is not diaphoretic.  Psychiatric: She has a normal mood and affect. Her behavior is normal. Judgment and thought content normal.  Nursing note and vitals reviewed.      Assessment & Plan:  Uterine leiomyoma, unspecified location - Plan: Ambulatory referral to Gynecology  Essential hypertension- Well controlled on diuretic only. Continue current lifestyle modifications w/ nutrition changes and fitness routine.  Vitamin D deficiency- Resume Vit D 50000 units once a week until no refills remain. Reminded pt that she should then get OTC Vit D 2000 units and take this supplement daily along w/ routine sun exposure.  Obesity- Encouraged commitment to getting healthier. Pt is seeing some positive benefits.  Meds ordered this encounter  Medications  . ergocalciferol (DRISDOL) 50000 UNITS capsule    Sig: Take 1 capsule (50,000 Units total) by mouth once a week.    Dispense:  4 capsule    Refill:  7

## 2015-04-28 IMAGING — US US TRANSVAGINAL NON-OB
1 series · 14 of 25 positions shown · non-contrast
Comparison: None

CLINICAL DATA: Menorrhagia



[Series 1: us transvaginal non-ob · 0.23mm/px · 14 of 78 slices shown]
[im 1/78]
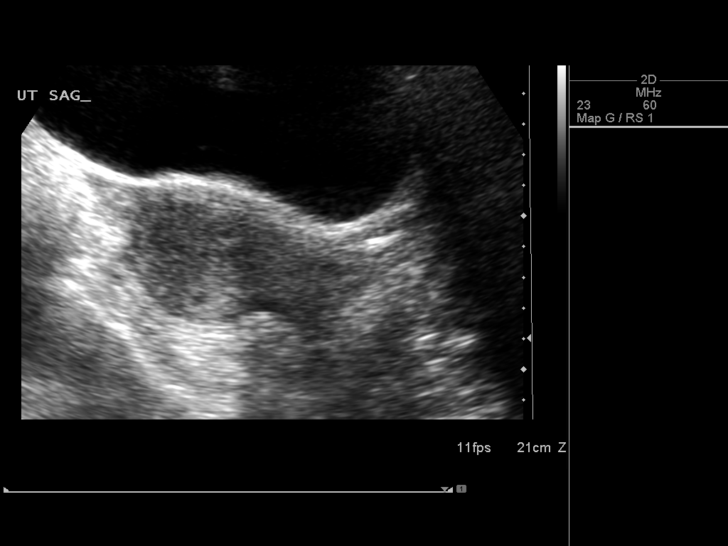
[im 7/78]
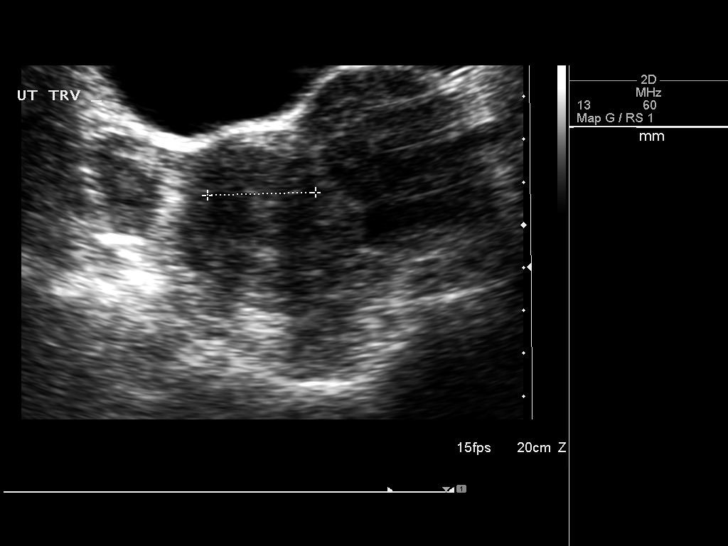
[im 13/78]
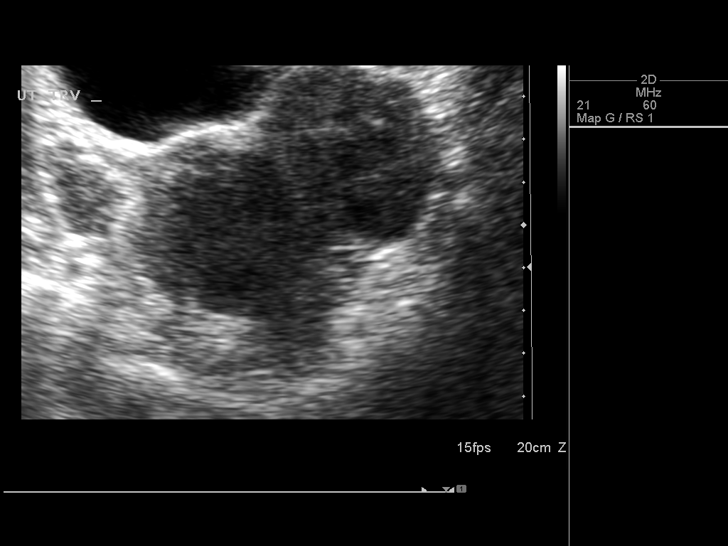
[im 20/78]
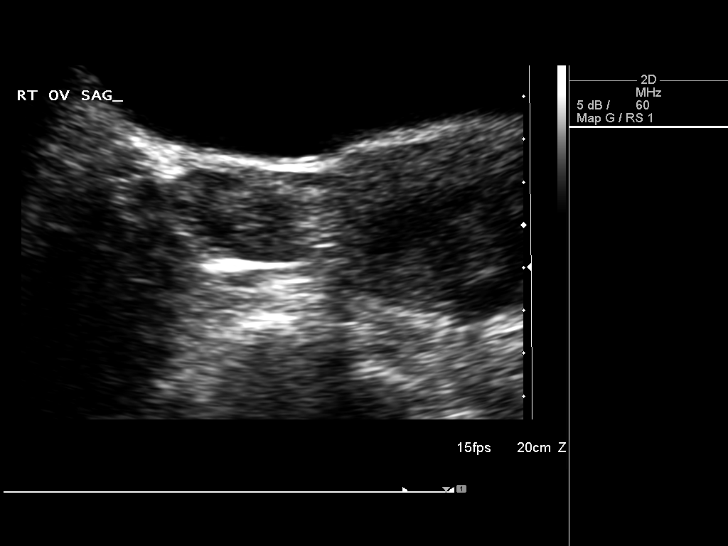
[im 26/78]
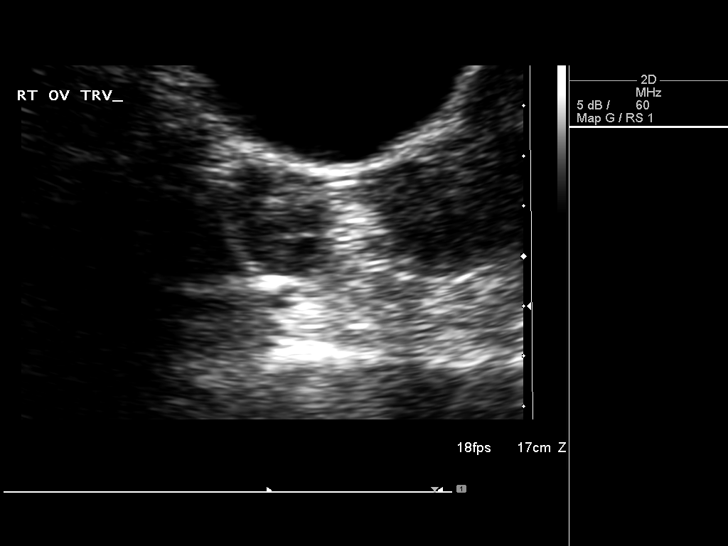
[im 29/78]
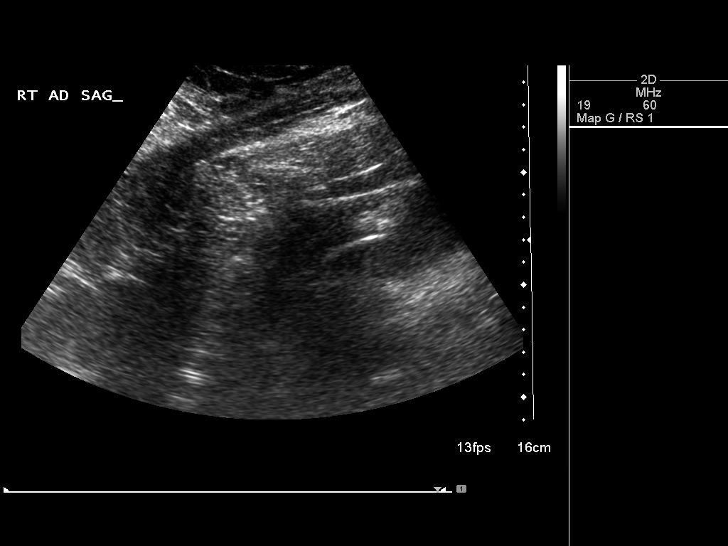
[im 36/78]
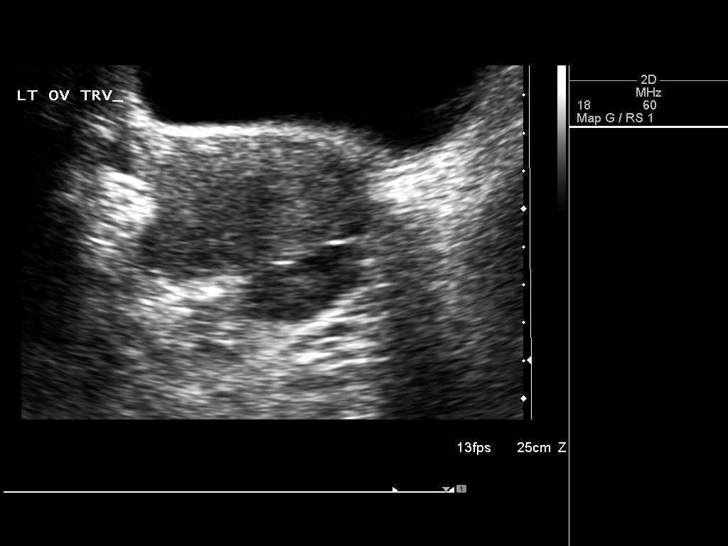
[im 42/78]
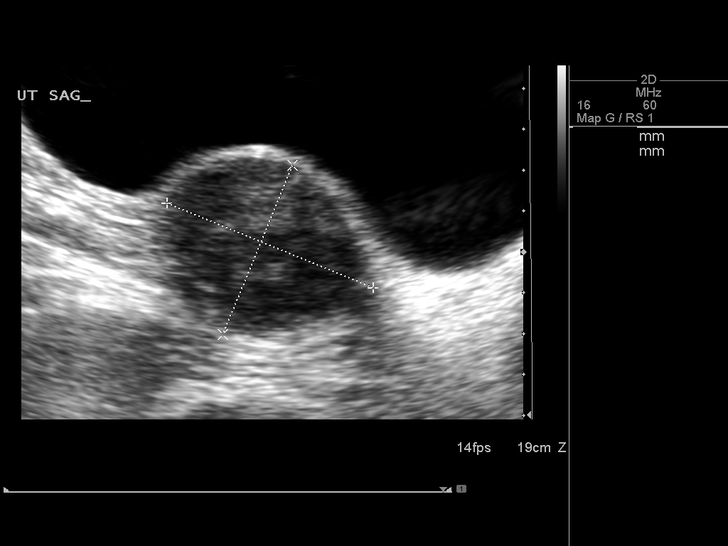
[im 49/78]
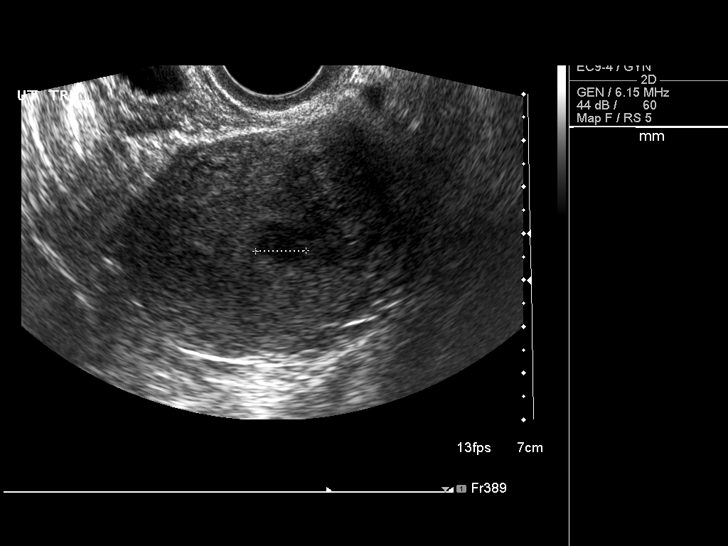
[im 52/78]
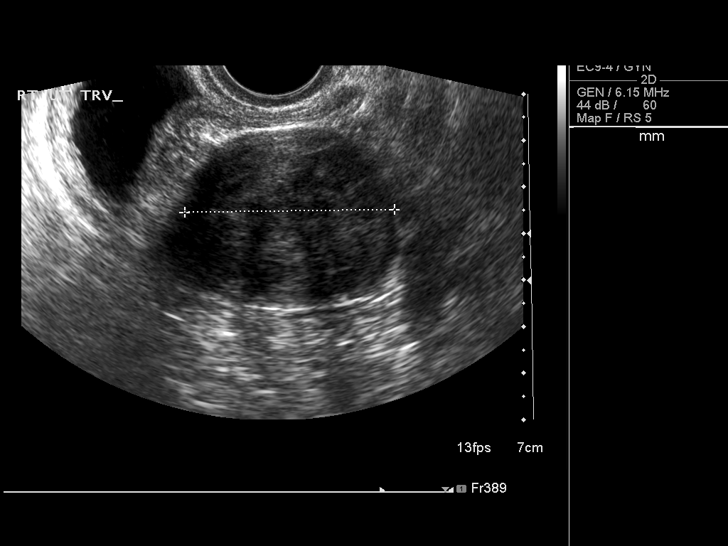
[im 58/78]
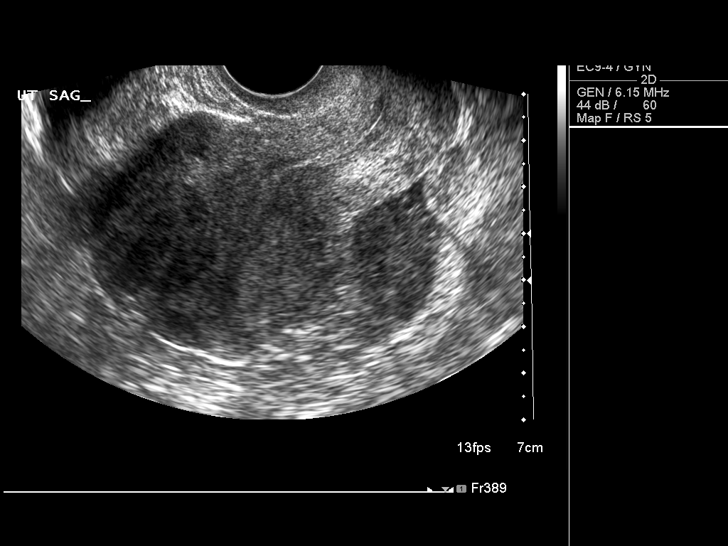
[im 65/78]
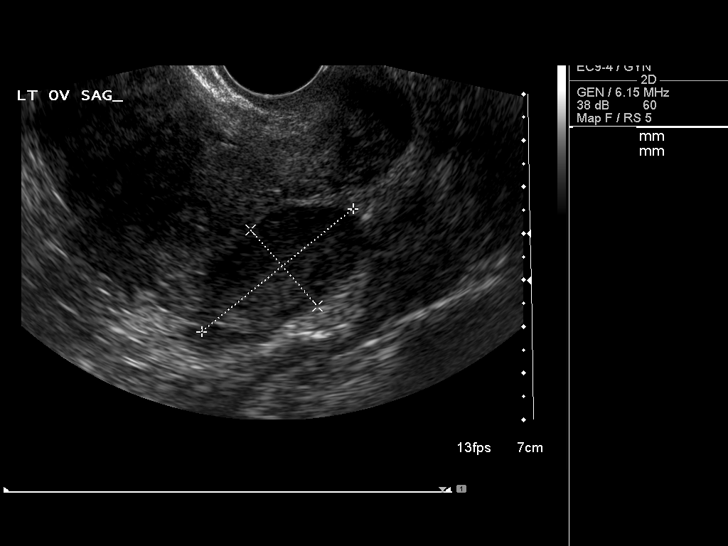
[im 71/78]
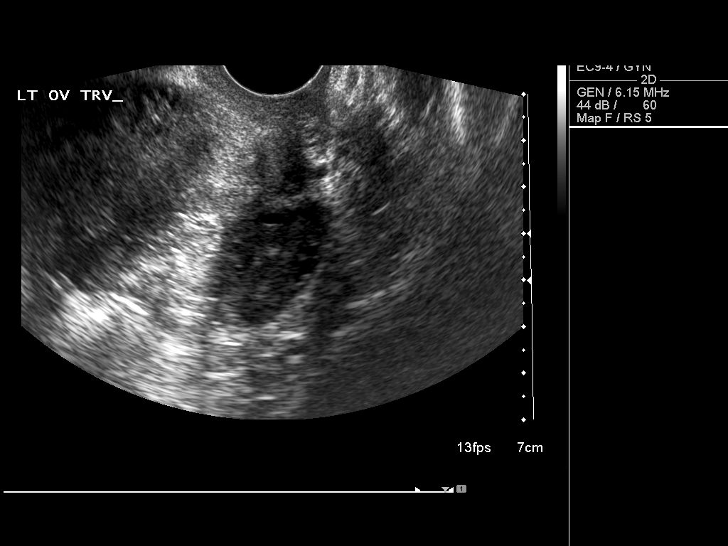
[im 78/78]
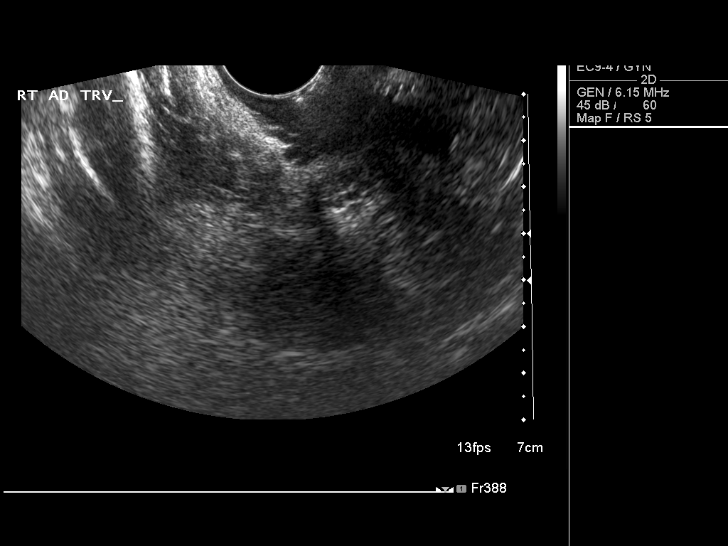

[14 of 25 positions shown; findings below may reference images not displayed]

FINDINGS: Uterus

Measurements: 3.5 x 4.9 x 5.3 cm. Multiple uterine fibroids,
including:

--4.2 x 4.0 x 4.5 cm pedunculated fibroid in the anterior uterine
fundus

--2.2 x 2.6 x 2.4 cm subserosal fibroid in the posterior uterine
fundus

--1.1 x 1.1 x 1.1 cm submucosal fibroid in the posterior uterine
body

Endometrium

Thickness: 10 mm.  No focal abnormality visualized.

Right ovary

Measurements: 3.6 x 2.1 x 2.0 cm. Normal appearance/no adnexal mass.

Left ovary

Measurements: 4.2 x 2.2 x 2.2 cm. Normal appearance/no adnexal mass.

Other findings

No free fluid.
IMPRESSION: Multiple uterine fibroids, measuring up to 4.5 cm, as above.

## 2016-04-01 ENCOUNTER — Other Ambulatory Visit: Payer: Self-pay | Admitting: Ophthalmology

## 2016-04-01 DIAGNOSIS — H4710 Unspecified papilledema: Secondary | ICD-10-CM

## 2016-04-07 ENCOUNTER — Other Ambulatory Visit (HOSPITAL_COMMUNITY)
Admission: RE | Admit: 2016-04-07 | Discharge: 2016-04-07 | Disposition: A | Discharge status: Home / Self Care | Payer: PRIVATE HEALTH INSURANCE | Source: Ambulatory Visit | Attending: Ophthalmology | Admitting: Ophthalmology

## 2016-04-07 ENCOUNTER — Other Ambulatory Visit: Payer: Self-pay | Admitting: Ophthalmology

## 2016-04-07 ENCOUNTER — Ambulatory Visit
Admission: RE | Admit: 2016-04-07 | Discharge: 2016-04-07 | Disposition: A | Discharge status: Home / Self Care | Payer: PRIVATE HEALTH INSURANCE | Source: Ambulatory Visit | Attending: Ophthalmology | Admitting: Ophthalmology

## 2016-04-07 DIAGNOSIS — H4710 Unspecified papilledema: Secondary | ICD-10-CM

## 2016-04-07 LAB — GRAM STAIN
GRAM STAIN: NONE SEEN
Gram Stain: NONE SEEN

## 2016-04-07 NOTE — Discharge Instructions (Signed)

## 2016-04-13 ENCOUNTER — Telehealth: Payer: Self-pay | Admitting: Radiology

## 2016-04-13 NOTE — Telephone Encounter (Signed)
Pt spoke with Dr. Jola Baptist over the weekend about headache. Called her this am and explained she needed to talk to Dr. Frederico Hamman about what he wants to do re: headache. Explained blood patch and that bedrest and fluid will take care of the headache. Pt states she is seeing Dr. Frederico Hamman this afternoon.  She will have office call or she will call if further follow up is needed.

## 2016-04-20 ENCOUNTER — Encounter: Payer: Self-pay | Admitting: Neurology

## 2016-04-20 ENCOUNTER — Ambulatory Visit (INDEPENDENT_AMBULATORY_CARE_PROVIDER_SITE_OTHER): Payer: PRIVATE HEALTH INSURANCE | Admitting: Neurology

## 2016-04-20 VITALS — BP 132/84 | HR 76 | Resp 16 | Ht 64.0 in | Wt 243.0 lb

## 2016-04-20 DIAGNOSIS — R51 Headache: Secondary | ICD-10-CM

## 2016-04-20 DIAGNOSIS — R0683 Snoring: Secondary | ICD-10-CM

## 2016-04-20 DIAGNOSIS — G932 Benign intracranial hypertension: Secondary | ICD-10-CM | POA: Diagnosis not present

## 2016-04-20 DIAGNOSIS — R519 Headache, unspecified: Secondary | ICD-10-CM

## 2016-04-20 DIAGNOSIS — G4710 Hypersomnia, unspecified: Secondary | ICD-10-CM | POA: Diagnosis not present

## 2016-04-20 NOTE — Progress Notes (Signed)
Subjective:    Patient ID: April Lynch is a 30 y.o. female.  HPI     Star Age, MD, PhD [facility] [address] [place], [place] [address]  Dear Dr. Frederico Hamman,  I saw your patient, April Lynch, upon your kind request in my neurologic clinic today for initial consultation of her pseudotumor cerebri and concern for underlying obstructive sleep apnea. The patient is unaccompanied today. As you know, [name] is a 30 year old right-handed woman with an underlying medical history of morbid obesity, vitamin D deficiency, hypertension, uterine leiomyoma, and astigmatism, who has had recurrent headaches. She had evidence of papilledema bilaterally. I reviewed your office note from 04/13/2016, which you kindly included. According to your notes, she had a lumbar puncture recently about 4 weeks ago which showed an elevated opening pressure of 30. I reviewed, puncture note from 04/07/2016 which showed an opening pressure of 30 cm and a closing pressure of 18 cm. She has been started on Diamox, currently 500 mg twice daily. She seems to tolerate this well. Visual acuity corrected is 20/40 on the right and 20/20 on the left, she had no obvious visual field deficits. Recent eye exam showed no edema of the optic disks. Of note, she snores and reports excessive daytime somnolence and morning headaches.  She lives alone, but family has reported snoring to her.  Of note, she had a CTH w/wo contrast on 08/07/14: IMPRESSION: Normal CT appearance of the brain.  She is trying to lose weight and has indeed lost about 17 lb in the last 2 months.  She has a FHx of OSA in her father, who uses a CPAP machine.  She has improved in her HAs, and has occasional morning. She has occasional nocturia.  She goes to bed around 8:30 to 9 PM.  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 She does not typically wake up rested. She feels tired  during the day. She does not typically drink caffeine. She drinks alcohol very occasionally, maybe once a month, she does not smoke cigarettes. She lives alone and is single. She does not have any children. She denies restless leg symptoms. She denies nocturia on a nightly basis. She had wisdom tooth removal today. Has not taken pain medication.  She has been able to tolerate the Diamox, currently 500 mg bid.   Her Past Medical History Is Significant For: Past Medical History  Diagnosis Date  . Asthma   . Hypertension     Her Past Surgical History Is Significant For: No past surgical history on file.  Her Family History Is Significant For: Family History  Problem Relation Age of Onset  . Heart murmur Mother   . Diabetes Father   . Gout Father     Her Social History Is Significant For: Social History   Social History  . Marital Status: Single    Spouse Name: N/A  . Number of Children: 0  . Years of Education: BA   Occupational History  . [employer]    Social History Main Topics  . Smoking status: Never Smoker   . Smokeless tobacco: None  . Alcohol Use: 0.0 oz/week    0 Standard drinks or equivalent per week     Comment: occasionally  . Drug Use: No  . Sexual Activity: Yes    Birth Control/ Protection: Pill   Other Topics Concern  . None   Social History Narrative  Rare caffeine use     Her Allergies Are:  Allergies  Allergen Reactions  . Prednisone Hives and Swelling  :   Her Current Medications Are:  Outpatient Encounter Prescriptions as of 04/20/2016  Medication Sig  . acetaZOLAMIDE (DIAMOX) 500 MG capsule Take 500 mg by mouth 2 (two) times daily.  Marland Kitchen albuterol (PROVENTIL HFA;VENTOLIN HFA) 108 (90 BASE) MCG/ACT inhaler Inhale 2 puffs into the lungs every 6 (six) hours as needed.  Marland Kitchen lisinopril-hydrochlorothiazide (PRINZIDE,ZESTORETIC) 20-25 MG tablet Take 1 tablet by mouth daily.  Marland Kitchen loratadine (CLARITIN) 10 MG tablet Take by mouth.   No facility-administered  encounter medications on file as of 04/20/2016.  :   Review of Systems:  Out of a complete 14 point review of systems, all are reviewed and negative with the exception of these symptoms as listed below:   Review of Systems  Neurological:       Patient reports that she has had headaches and blurred vision for years.   Psychiatric/Behavioral:       Racing thoughts    Objective:  Neurologic Exam  Physical Exam Physical Examination:   Filed Vitals:   04/20/16 1445  BP: 132/84  Pulse: 76  Resp: 16   General Examination: The patient is a very pleasant 30 y.o. female in no acute distress. She appears well-developed and well-nourished and well groomed. She is morbidly obese.   HEENT: Normocephalic, atraumatic, pupils are equal, round and reactive to light and accommodation. Funduscopic exam is normal with sharp disc margins noted. Extraocular tracking is good without limitation to gaze excursion or nystagmus noted. 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 Normal smooth pursuit is noted. Hearing is grossly intact. Face is symmetric with normal facial animation and normal facial sensation. Speech is clear with no dysarthria noted. There is no hypophonia. There is no lip, neck/head, jaw or voice tremor. Neck is supple with full range of passive and active motion. There are no carotid bruits on auscultation. Oropharynx exam reveals: mild mouth dryness, adequate dental hygiene and moderate airway crowding, due to smaller airway entry, tonsils of 2+, larger appearing uvula. Mallampati is class II. Tongue protrudes centrally and palate elevates symmetrically. Tonsils are 2+ in size. Neck size is 15.25 inches. She has a Mild overbite.   Chest: Clear to auscultation without wheezing, rhonchi or crackles noted.  Heart: S1+S2+0, regular and normal without murmurs, rubs or gallops noted.   Abdomen: Soft, non-tender and non-distended  with normal bowel sounds appreciated on auscultation.  Extremities: There is no pitting edema in the distal lower extremities bilaterally. Pedal pulses are intact.  Skin: Warm and dry without trophic changes noted.  Musculoskeletal: exam reveals no obvious joint deformities, tenderness or joint swelling or erythema.   Neurologically:  Mental status: The patient is awake, alert and oriented in all 4 spheres. Her immediate and remote memory, attention, language skills and fund of knowledge are appropriate. There is no evidence of aphasia, agnosia, apraxia or anomia. Speech is clear with normal prosody and enunciation. Thought process is linear. Mood is normal and affect is normal.  Cranial nerves II - XII are as described above under HEENT exam. In addition: shoulder shrug is normal with equal shoulder height noted. Motor exam: Normal bulk, strength and tone is noted. There is no drift, tremor or rebound. Romberg is negative. Reflexes are 2+ throughout. Babinski: Toes are flexor bilaterally. Fine motor skills and coordination: intact  with normal finger taps, normal hand movements, normal rapid alternating patting, normal foot taps and normal foot agility.  Cerebellar testing: No dysmetria or intention tremor on finger to nose testing. Heel to shin is unremarkable bilaterally. There is no truncal or gait ataxia.  Sensory exam: intact to light touch, pinprick, vibration, temperature sense in the upper and lower extremities.  Gait, station and balance: She stands easily. No veering to one side is noted. No leaning to one side is noted. Posture is age-appropriate and stance is narrow based. Gait shows normal stride length and normal pace. No problems turning are noted. Tandem walk is slightly difficult for her.                Assessment and Plan:  In summary, Trionna Serrao is a very pleasant 30 y.o.-year old female with an underlying medical history of morbid obesity, vitamin D deficiency, hypertension,  uterine leiomyoma, and astigmatism, who has recently been diagnosed with idiopathic intracranial hypertension. She had a lumbar puncture earlier this month which confirmed an increased opening pressure of 30 cm. She has had some improvement in her headaches and has established treatment with Diamox which she has been able to tolerate. She still has some blurry vision but no clear-cut evidence of papilledema at this time on my exam. She had a fairly good eye exam most recently with your office. She is advised that she also may be at risk of obstructive sleep apnea in light of her obesity, family history of OSA and crowded airway. I had a long chat with the patient about my findings and the diagnosis of pseudotumor cerebri, and OSA, the prognosis and treatment options. We talked about medical treatments, surgical interventions and non-pharmacological approaches. I explained in particular the risks and ramifications of untreated moderate to severe OSA, especially with respect to developing cardiovascular disease down the Road, including congestive heart failure, difficult to treat hypertension, cardiac arrhythmias, or stroke. Even type 2 diabetes has, in part, been linked to untreated OSA. Symptoms of untreated OSA include daytime sleepiness, memory problems, mood irritability and mood disorder such as depression and anxiety, lack of energy, as well as recurrent headaches, especially morning headaches. We talked about trying to maintain a healthy lifestyle in general, as well as the importance of weight control. I encouraged the patient to eat healthy, exercise daily and keep well hydrated, to keep a scheduled bedtime and wake time routine, to not skip any meals and eat healthy snacks in between meals. I advised the patient not to drive when feeling sleepy. I recommended the following at this time: MRI brain without contrast, MRV brain with and without contrast, if possible as she has multiple piercings most of which  she cannot remove as I understand. In addition, I suggested we proceed with a sleep study with potential positive airway pressure titration. (We will score hypopneas at 3% and split the sleep study into diagnostic and treatment portion, if the estimated. 2 hour AHI is >15/h).   She is advised to continue with Diamox at the current dose.   I explained the sleep test procedure to the patient and also outlined possible surgical and non-surgical treatment options of OSA, including the use of a custom-made dental device (which would require a referral to a specialist dentist or oral surgeon), upper airway surgical options, such as pillar implants, radiofrequency surgery, tongue base surgery, and UPPP (which would involve a referral to an ENT surgeon). Rarely, jaw surgery such as mandibular advancement may be considered.  I also explained the CPAP treatment option to the patient, who indicated that she would be willing to try CPAP if the need arises. I explained the importance of being compliant with PAP treatment, not only for insurance purposes but primarily to improve Her symptoms, and for the patient's long term health benefit, including to reduce Her cardiovascular risks. I answered all her questions today and the patient was in agreement. I would like to see her back after the studies are completed and we will update her over the phone in the interim as well.    Thank you very much for allowing me to participate in the care of this nice patient. If I can be of any further assistance to you please do not hesitate to call me at [phone].  Sincerely,   Star Age, MD, PhD

## 2016-04-20 NOTE — Patient Instructions (Addendum)
We will do a brain scan, called MRI and MRV (for brain venous blood vessels) and call you with the test results. We will have to schedule you for this on a separate date. This test requires authorization from your insurance, and we will take care of the insurance process.  Based on your symptoms and your exam I believe you are at risk for obstructive sleep apnea or OSA, and I think we should proceed with a sleep study to determine whether you do or do not have OSA and how severe it is. If you have more than mild OSA, I want you to consider treatment with CPAP. Please remember, the risks and ramifications of moderate to severe obstructive sleep apnea or OSA are: Cardiovascular disease, including congestive heart failure, stroke, difficult to control hypertension, arrhythmias, and even type 2 diabetes has been linked to untreated OSA. Sleep apnea causes disruption of sleep and sleep deprivation in most cases, which, in turn, can cause recurrent headaches, problems with memory, mood, concentration, focus, and vigilance. Most people with untreated sleep apnea report excessive daytime sleepiness, which can affect their ability to drive. Please do not drive if you feel sleepy.   I will likely see you back after your sleep study to go over the test results and where to go from there. We will call you after your sleep study to advise about the results (most likely, you will hear from Beverlee Nims, my nurse) and to set up an appointment at the time, as necessary.    Our sleep lab administrative assistant, Arrie Aran will meet with you or call you to schedule your sleep study. If you don't hear back from her by next week please feel free to call her at [phone]. This is her direct line and please leave a message with your phone number to call back if you get the voicemail box. She will call back as soon as possible.   You can continue with the Diamox at the current dose.

## 2016-04-22 ENCOUNTER — Ambulatory Visit: Payer: PRIVATE HEALTH INSURANCE | Admitting: Neurology

## 2016-05-04 ENCOUNTER — Other Ambulatory Visit: Payer: Self-pay | Admitting: Obstetrics and Gynecology

## 2016-05-04 DIAGNOSIS — N926 Irregular menstruation, unspecified: Secondary | ICD-10-CM

## 2016-05-13 ENCOUNTER — Ambulatory Visit
Admission: RE | Admit: 2016-05-13 | Discharge: 2016-05-13 | Disposition: A | Discharge status: Home / Self Care | Payer: PRIVATE HEALTH INSURANCE | Source: Ambulatory Visit | Attending: Neurology | Admitting: Neurology

## 2016-05-13 ENCOUNTER — Ambulatory Visit
Admission: RE | Admit: 2016-05-13 | Discharge: 2016-05-13 | Disposition: A | Discharge status: Home / Self Care | Payer: PRIVATE HEALTH INSURANCE | Source: Ambulatory Visit | Attending: Obstetrics and Gynecology | Admitting: Obstetrics and Gynecology

## 2016-05-13 ENCOUNTER — Telehealth: Payer: Self-pay | Admitting: *Deleted

## 2016-05-13 ENCOUNTER — Telehealth: Payer: Self-pay | Admitting: Neurology

## 2016-05-13 DIAGNOSIS — R519 Headache, unspecified: Secondary | ICD-10-CM

## 2016-05-13 DIAGNOSIS — R0683 Snoring: Secondary | ICD-10-CM

## 2016-05-13 DIAGNOSIS — R51 Headache: Secondary | ICD-10-CM

## 2016-05-13 DIAGNOSIS — G4710 Hypersomnia, unspecified: Secondary | ICD-10-CM

## 2016-05-13 DIAGNOSIS — N926 Irregular menstruation, unspecified: Secondary | ICD-10-CM

## 2016-05-13 DIAGNOSIS — G932 Benign intracranial hypertension: Secondary | ICD-10-CM

## 2016-05-13 NOTE — Telephone Encounter (Signed)
LAUREN/[facility] PA:[id number] Shalece Bey [phone] * [phone] 89 NEEDS TO SPEAK SOMEONE ABOUT MRI THEY CAN NOT DO Y

## 2016-05-13 NOTE — Telephone Encounter (Signed)
How would you like to proceed? 

## 2016-05-13 NOTE — Telephone Encounter (Signed)
Will order head CT w/wo contrast.

## 2016-05-13 NOTE — Telephone Encounter (Signed)
I spoke to [facility] and advised them that we are sending orders for CT scan

## 2016-05-13 NOTE — Telephone Encounter (Signed)
Pt called said she just left GI but could not have MRI today due to piercings in each ear which she could not take out. She was advised to call by GI to find out what other recommendations the provider would have.

## 2016-05-15 ENCOUNTER — Telehealth: Payer: Self-pay | Admitting: *Deleted

## 2016-05-15 NOTE — Telephone Encounter (Signed)
April Lynch             CID [id number]  Patient SAME                 Pt's Dr Rexene Alberts        Area Code 910 Phone# [phone] * DOB Jan 31, 1986      RE GOT 2 PIERCINGS OUT/CAN DO MRI INSTEAD OF CT      PCB AND LET HER KNOW                                 Disp:Y/N N If Y = C/B If No Response In 66minutes ============================================================

## 2016-05-21 ENCOUNTER — Emergency Department (HOSPITAL_BASED_OUTPATIENT_CLINIC_OR_DEPARTMENT_OTHER): Payer: PRIVATE HEALTH INSURANCE

## 2016-05-21 ENCOUNTER — Emergency Department (HOSPITAL_BASED_OUTPATIENT_CLINIC_OR_DEPARTMENT_OTHER)
Admission: EM | Admit: 2016-05-21 | Discharge: 2016-05-22 | Disposition: A | Discharge status: Home / Self Care | Payer: PRIVATE HEALTH INSURANCE | Attending: Emergency Medicine | Admitting: Emergency Medicine

## 2016-05-21 ENCOUNTER — Encounter (HOSPITAL_BASED_OUTPATIENT_CLINIC_OR_DEPARTMENT_OTHER): Payer: Self-pay

## 2016-05-21 DIAGNOSIS — R109 Unspecified abdominal pain: Secondary | ICD-10-CM

## 2016-05-21 DIAGNOSIS — Z79899 Other long term (current) drug therapy: Secondary | ICD-10-CM | POA: Diagnosis not present

## 2016-05-21 DIAGNOSIS — I10 Essential (primary) hypertension: Secondary | ICD-10-CM | POA: Diagnosis not present

## 2016-05-21 DIAGNOSIS — J45909 Unspecified asthma, uncomplicated: Secondary | ICD-10-CM | POA: Insufficient documentation

## 2016-05-21 DIAGNOSIS — R1084 Generalized abdominal pain: Secondary | ICD-10-CM | POA: Diagnosis present

## 2016-05-21 DIAGNOSIS — K529 Noninfective gastroenteritis and colitis, unspecified: Secondary | ICD-10-CM | POA: Diagnosis not present

## 2016-05-21 LAB — COMPREHENSIVE METABOLIC PANEL
ALK PHOS: 52 U/L (ref 38–126)
ALT: 23 U/L (ref 14–54)
ANION GAP: 9 (ref 5–15)
AST: 18 U/L (ref 15–41)
Albumin: 3.4 g/dL — ABNORMAL LOW (ref 3.5–5.0)
BUN: 11 mg/dL (ref 6–20)
CALCIUM: 8.8 mg/dL — AB (ref 8.9–10.3)
CO2: 25 mmol/L (ref 22–32)
Chloride: 103 mmol/L (ref 101–111)
Creatinine, Ser: 0.61 mg/dL (ref 0.44–1.00)
GFR calc non Af Amer: >60 mL/min (ref >60)
Glucose, Bld: 99 mg/dL (ref 65–99)
POTASSIUM: 3.6 mmol/L (ref 3.5–5.1)
SODIUM: 137 mmol/L (ref 135–145)
TOTAL PROTEIN: 7.2 g/dL (ref 6.5–8.1)
Total Bilirubin: 0.3 mg/dL (ref 0.3–1.2)

## 2016-05-21 LAB — CBC WITH DIFFERENTIAL/PLATELET
Basophils Absolute: 0 10*3/uL (ref 0.0–0.1)
Basophils Relative: 0 %
EOS ABS: 0.2 10*3/uL (ref 0.0–0.7)
EOS PCT: 1 %
HCT: 34.7 % — ABNORMAL LOW (ref 36.0–46.0)
HEMOGLOBIN: 11.1 g/dL — AB (ref 12.0–15.0)
LYMPHS ABS: 4.3 10*3/uL — AB (ref 0.7–4.0)
Lymphocytes Relative: 31 %
MCH: 24.1 pg — AB (ref 26.0–34.0)
MCHC: 32 g/dL (ref 30.0–36.0)
MCV: 75.4 fL — ABNORMAL LOW (ref 78.0–100.0)
MONO ABS: 0.8 10*3/uL (ref 0.1–1.0)
MONOS PCT: 5 %
Neutro Abs: 8.7 10*3/uL — ABNORMAL HIGH (ref 1.7–7.7)
Neutrophils Relative %: 63 %
PLATELETS: 353 10*3/uL (ref 150–400)
RBC: 4.6 MIL/uL (ref 3.87–5.11)
RDW: 16.7 % — AB (ref 11.5–15.5)
WBC: 14 10*3/uL — ABNORMAL HIGH (ref 4.0–10.5)

## 2016-05-21 LAB — URINE MICROSCOPIC-ADD ON

## 2016-05-21 LAB — URINALYSIS, ROUTINE W REFLEX MICROSCOPIC
Bilirubin Urine: NEGATIVE
Glucose, UA: NEGATIVE mg/dL
Ketones, ur: NEGATIVE mg/dL
LEUKOCYTES UA: NEGATIVE
Nitrite: NEGATIVE
PROTEIN: NEGATIVE mg/dL
Specific Gravity, Urine: 1.026 (ref 1.005–1.030)
pH: 6 (ref 5.0–8.0)

## 2016-05-21 LAB — PREGNANCY, URINE: PREG TEST UR: NEGATIVE

## 2016-05-21 LAB — LIPASE, BLOOD: Lipase: 27 U/L (ref 11–51)

## 2016-05-21 MED ORDER — KETOROLAC TROMETHAMINE 30 MG/ML IJ SOLN
30.0000 mg | Freq: Once | INTRAMUSCULAR | Status: AC
  Administered 2016-05-21: 30 mg via INTRAVENOUS
  Filled 2016-05-21: qty 1

## 2016-05-21 MED ORDER — IOPAMIDOL (ISOVUE-300) INJECTION 61%
100.0000 mL | Freq: Once | INTRAVENOUS | Status: AC | PRN
  Administered 2016-05-21: 100 mL via INTRAVENOUS

## 2016-05-21 NOTE — ED Notes (Addendum)
Lower abd pain x 2 days-n/vomited x 1-no diarrhea, no vaginal d/c-pos painful urination-NAD-steady gait

## 2016-05-21 NOTE — ED Provider Notes (Signed)
CSN: [id number]     Arrival date & time 05/21/16  2050 History   First MD Initiated Contact with Patient 05/21/16 2131     Chief Complaint  Patient presents with  . Abdominal Pain     (Consider location/radiation/quality/duration/timing/severity/associated sxs/prior Treatment) HPI April Lynch is a 30 y.o. female here from urgent care for evaluation of abdominal pain. Patient course and 6:30 PM last night, she has had upper and lower abdominal pain. She reports associated nausea with one episode of nonbloody and nonbilious emesis. She reports diffuse abdominal discomfort that she cannot characterize the states it is uncomfortable to urinate and defecate causing the discomfort in her lower abdomen to worsen. She does report she just recently started a new birth control on Thursday. She also reports her menstrual cycle started last Thursday. Denies any other fevers, chills, cough, chest pain or shortness of breath, unusual vaginal discharge or back pain.  Past Medical History  Diagnosis Date  . Asthma   . Hypertension    History reviewed. No pertinent past surgical history. Family History  Problem Relation Age of Onset  . Heart murmur Mother   . Diabetes Father   . Gout Father    Social History  Substance Use Topics  . Smoking status: Never Smoker   . Smokeless tobacco: None  . Alcohol Use: 0.0 oz/week    0 Standard drinks or equivalent per week     Comment: occasionally   OB History    No data available     Review of Systems A 10 point review of systems was completed and was negative except for pertinent positives and negatives as mentioned in the history of present illness     Allergies  Prednisone  Home Medications   Prior to Admission medications   Medication Sig Start Date End Date Taking? Authorizing Provider  Ethynodiol Diac-Eth Estradiol (ZOVIA 1/50E, 28, PO) Take by mouth.   Yes Historical Provider, MD  acetaZOLAMIDE (DIAMOX) 500 MG capsule Take 500 mg by  mouth 2 (two) times daily.    Historical Provider, MD  albuterol (PROVENTIL HFA;VENTOLIN HFA) 108 (90 BASE) MCG/ACT inhaler Inhale 2 puffs into the lungs every 6 (six) hours as needed.    Historical Provider, MD  ciprofloxacin (CIPRO) 500 MG tablet Take 1 tablet (500 mg total) by mouth 2 (two) times daily. 05/22/16   Comer Locket, PA-C  HYDROcodone-acetaminophen (NORCO/VICODIN) 5-325 MG tablet Take 1-2 tablets by mouth every 4 (four) hours as needed. 05/22/16   Comer Locket, PA-C  lisinopril-hydrochlorothiazide (PRINZIDE,ZESTORETIC) 20-25 MG tablet Take 1 tablet by mouth daily.    Historical Provider, MD  loratadine (CLARITIN) 10 MG tablet Take by mouth.    Historical Provider, MD  metroNIDAZOLE (FLAGYL) 500 MG tablet Take 1 tablet (500 mg total) by mouth 2 (two) times daily. 05/22/16   Nettye Flegal, PA-C   BP 137/71 mmHg  Pulse 60  Temp(Src) 99.4 F (37.4 C) (Oral)  Resp 18  Ht 5\' 4"  (1.626 m)  Wt 110.224 kg  BMI 41.69 kg/m2  SpO2 98%  LMP 05/14/2016 Physical Exam  Constitutional: She is oriented to person, place, and time. She appears well-developed and well-nourished.  Obese African American female  HENT:  Head: Normocephalic and atraumatic.  Mouth/Throat: Oropharynx is clear and moist.  Eyes: Conjunctivae are normal. Pupils are equal, round, and reactive to light. Right eye exhibits no discharge. Left eye exhibits no discharge. No scleral icterus.  Neck: Neck supple.  Cardiovascular: Normal rate, regular rhythm and normal heart  sounds.   Pulmonary/Chest: Effort normal and breath sounds normal. No respiratory distress. She has no wheezes. She has no rales.  Abdominal: Soft.  Tenderness diffusely, worse in the infraumbilical region and left upper abdomen. Abdomen is soft, nondistended without rebound or guarding. Large panniculus  Musculoskeletal: She exhibits no tenderness.  Neurological: She is alert and oriented to person, place, and time.  Cranial Nerves II-XII grossly  intact  Skin: Skin is warm and dry. No rash noted.  Psychiatric: She has a normal mood and affect.  Nursing note and vitals reviewed.   ED Course  Procedures (including critical care time) Labs Review Labs Reviewed  URINALYSIS, ROUTINE W REFLEX MICROSCOPIC (NOT AT [facility]) - Abnormal; Notable for the following:    Hgb urine dipstick LARGE (*)    All other components within normal limits  URINE MICROSCOPIC-ADD ON - Abnormal; Notable for the following:    Squamous Epithelial / LPF 6-30 (*)    Bacteria, UA FEW (*)    All other components within normal limits  CBC WITH DIFFERENTIAL/PLATELET - Abnormal; Notable for the following:    WBC 14.0 (*)    Hemoglobin 11.1 (*)    HCT 34.7 (*)    MCV 75.4 (*)    MCH 24.1 (*)    RDW 16.7 (*)    Neutro Abs 8.7 (*)    Lymphs Abs 4.3 (*)    All other components within normal limits  COMPREHENSIVE METABOLIC PANEL - Abnormal; Notable for the following:    Calcium 8.8 (*)    Albumin 3.4 (*)    All other components within normal limits  PREGNANCY, URINE  LIPASE, BLOOD    Imaging Review Ct Abdomen Pelvis W Contrast  05/22/2016  CLINICAL DATA:  Right and left lower quadrant pain for 2 days. Nausea and vomiting. EXAM: CT ABDOMEN AND PELVIS WITH CONTRAST TECHNIQUE: Multidetector CT imaging of the abdomen and pelvis was performed using the standard protocol following bolus administration of intravenous contrast. CONTRAST:  17mL ISOVUE-300 IOPAMIDOL (ISOVUE-300) INJECTION 61% COMPARISON:  Pelvic ultrasound 05/13/2016 FINDINGS: Lower chest:  The included lung bases are clear. Liver: No focal lesion. Hepatobiliary: Gallbladder is decompressed.  No calcified stone. Pancreas: No ductal dilatation or inflammation. Spleen: Normal. Adrenal glands: No nodule. Kidneys: Symmetric renal enhancement.  No hydronephrosis. Stomach/Bowel: The stomach is physiologically distended. There is gastric wall thickening in the antrum with adjacent perigastric edema. There multifocal  areas of small bowel wall thickening, primarily in the left abdomen, with the segments of intervening normal small bowel. Small bowel wall thickening also with associated perienteric edema. There is no evidence of obstruction or bowel dilatation. No terminal ileum abnormality. Descending colon is decompressed, equivocal wall thickening in the midportion no additional colonic wall thickening. The appendix is air-filled and normal. Vascular/Lymphatic: Small aortic caval lymph nodes. There are small scattered mesenteric lymph nodes. Abdominal aorta is normal in caliber. Reproductive: Enlarged heterogeneous uterus consistent with fibroids. Ovaries versus symmetric and normal in size. Bladder: Decompressed. Other: There is moderate volume of intra-abdominal free fluid a right pericolic gutter, perihepatic, scattered throughout the mesentery adjacent to small bowel thickening, and tracking in the pelvis. This appears mildly complex. There is no free air. No well-defined abscess. Musculoskeletal: There are no acute or suspicious osseous abnormalities. IMPRESSION: 1. Multi focal bowel inflammation involving the stomach, small bowel, and possibly an ascending colon. Findings are suspicious for Crohn's disease. There is associated free fluid in the abdomen and pelvis that appears mildly complex. No free air. Infectious  etiology is also considered but felt less likely. 2. Normal appendix. 3. Uterine fibroids. Electronically Signed   By: Jeb Levering M.D.   On: 05/22/2016 00:23   I have personally reviewed and evaluated these images and lab results as part of my medical decision-making.   EKG Interpretation None     Filed Vitals:   05/21/16 2056 05/21/16 2300  BP: 130/76 137/71  Pulse: 72 60  Temp: 99.4 F (37.4 C)   TempSrc: Oral   Resp: 18 18  Height: 5\' 4"  (1.626 m)   Weight: 110.224 kg   SpO2: 99% 98%   Meds given in ED:  Medications  ketorolac (TORADOL) 30 MG/ML injection 30 mg (30 mg Intravenous  Given 05/21/16 2224)  iopamidol (ISOVUE-300) 61 % injection 100 mL (100 mLs Intravenous Contrast Given 05/21/16 2349)  morphine 4 MG/ML injection 4 mg (4 mg Intravenous Given 05/22/16 0039)  dexamethasone (DECADRON) injection 10 mg (10 mg Intravenous Given 05/22/16 0040)    New Prescriptions   CIPROFLOXACIN (CIPRO) 500 MG TABLET    Take 1 tablet (500 mg total) by mouth 2 (two) times daily.   HYDROCODONE-ACETAMINOPHEN (NORCO/VICODIN) 5-325 MG TABLET    Take 1-2 tablets by mouth every 4 (four) hours as needed.   METRONIDAZOLE (FLAGYL) 500 MG TABLET    Take 1 tablet (500 mg total) by mouth 2 (two) times daily.    MDM  Here from urgent care for appendicitis rule out. Patient does have diffuse abdominal discomfort on exam, worse in infraumbilical and left abdomen. She is hemodynamically stable and afebrile. Screening labs significant for a leukocytosis of 14. No evidence of urine infection. She is currently menstruating. CT abdomen concerning for Crohn's disease. Patient treated with IV pain medicines, IV Decadron. Cannot rule out infectious etiology, will cover with Cipro and Flagyl. Patient given referral to gastroenterology. Overall, she appears well, nontoxic and appropriate for discharge. Discussed strict return precautions. She verbalizes understanding and agrees with this plan. Prior to discharge, I discussed and reviewed this case with my attending, Dr. Claudine Mouton Final diagnoses:  Abdominal pain, unspecified abdominal location  Colitis        Comer Locket, PA-C 05/22/16 0050  Fredia Sorrow, MD 05/23/16 1730

## 2016-05-22 MED ORDER — HYDROCODONE-ACETAMINOPHEN 5-325 MG PO TABS: 1.0000 | ORAL_TABLET | ORAL | Status: AC | PRN

## 2016-05-22 MED ORDER — MORPHINE SULFATE (PF) 4 MG/ML IV SOLN
4.0000 mg | Freq: Once | INTRAVENOUS | Status: AC
Start: 2016-05-22 — End: 2016-05-22
  Administered 2016-05-22: 4 mg via INTRAVENOUS
  Filled 2016-05-22: qty 1

## 2016-05-22 MED ORDER — CIPROFLOXACIN HCL 500 MG PO TABS: 500.0000 mg | ORAL_TABLET | Freq: Two times a day (BID) | ORAL | Status: DC

## 2016-05-22 MED ORDER — DEXAMETHASONE SODIUM PHOSPHATE 10 MG/ML IJ SOLN
10.0000 mg | Freq: Once | INTRAMUSCULAR | Status: AC
  Administered 2016-05-22: 10 mg via INTRAVENOUS
  Filled 2016-05-22: qty 1

## 2016-05-22 MED ORDER — METRONIDAZOLE 500 MG PO TABS: 500.0000 mg | ORAL_TABLET | Freq: Two times a day (BID) | ORAL | Status: DC

## 2016-05-22 NOTE — Discharge Instructions (Signed)
Your symptoms are possibly due to Crohn's disease. Please take your antibiotics as prescribed. You may use your pain medicine for severe pain as we discussed. It is important for you to follow-up with equal gastroenterology, please call their office tomorrow in order to set up an appointment for reevaluation. Return to ED for any new or worsening symptoms as we discussed.

## 2016-05-28 DIAGNOSIS — G932 Benign intracranial hypertension: Secondary | ICD-10-CM | POA: Diagnosis not present

## 2016-05-29 ENCOUNTER — Other Ambulatory Visit: Payer: PRIVATE HEALTH INSURANCE

## 2016-05-29 ENCOUNTER — Ambulatory Visit
Admission: RE | Admit: 2016-05-29 | Discharge: 2016-05-29 | Disposition: A | Discharge status: Home / Self Care | Payer: PRIVATE HEALTH INSURANCE | Source: Ambulatory Visit | Attending: Neurology | Admitting: Neurology

## 2016-05-29 DIAGNOSIS — G932 Benign intracranial hypertension: Secondary | ICD-10-CM

## 2016-05-29 MED ORDER — IOPAMIDOL (ISOVUE-300) INJECTION 61%
75.0000 mL | Freq: Once | INTRAVENOUS | Status: AC | PRN
  Administered 2016-05-29: 75 mL via INTRAVENOUS

## 2016-06-01 NOTE — Progress Notes (Signed)
Quick Note:  Normal CTH w/wo contrast. Please notify patient, thx Star Age, MD, PhD [facility] ([place])  ______

## 2016-06-02 ENCOUNTER — Telehealth: Payer: Self-pay

## 2016-06-02 NOTE — Telephone Encounter (Signed)
-----   Message from Star Age, MD sent at 06/01/2016 11:36 AM EDT ----- Normal CTH w/wo contrast. Please notify patient, thx Star Age, MD, PhD [facility] ([place])

## 2016-06-02 NOTE — Telephone Encounter (Signed)
I spoke to patient and she is aware of results.  

## 2016-06-21 ENCOUNTER — Ambulatory Visit (INDEPENDENT_AMBULATORY_CARE_PROVIDER_SITE_OTHER): Payer: PRIVATE HEALTH INSURANCE | Admitting: Neurology

## 2016-06-21 DIAGNOSIS — G4710 Hypersomnia, unspecified: Secondary | ICD-10-CM

## 2016-06-21 DIAGNOSIS — R0683 Snoring: Secondary | ICD-10-CM

## 2016-06-21 DIAGNOSIS — G4720 Circadian rhythm sleep disorder, unspecified type: Secondary | ICD-10-CM

## 2016-06-25 ENCOUNTER — Telehealth: Payer: Self-pay | Admitting: Neurology

## 2016-06-25 NOTE — Telephone Encounter (Signed)
Patient referred by Dr. Frederico Hamman (ophth), seen by me on 04/20/16 for PTC and concern for OSA, diagnostic PSG on 06/21/16.   Please call and notify the patient that the recent sleep study did not show any significant obstructive sleep apnea. Please inform patient that I would like to go over the details of the study during a follow up appointment. Arrange a routine followup appointment; we will talk about her PTC and headaches at the time. Also, route or fax report to PCP and referring MD, if other than PCP.  Once you have spoken to patient, you can close this encounter.   Thanks,  Star Age, MD, PhD [facility])

## 2016-06-25 NOTE — Telephone Encounter (Signed)
LM with results and recommendations below (per DPR). Asked her to call back and make f/u appt with Dr. Rexene Alberts. Copy of report sent to PCP.

## 2016-08-11 ENCOUNTER — Ambulatory Visit (INDEPENDENT_AMBULATORY_CARE_PROVIDER_SITE_OTHER): Payer: PRIVATE HEALTH INSURANCE | Admitting: Neurology

## 2016-08-11 ENCOUNTER — Encounter: Payer: Self-pay | Admitting: Neurology

## 2016-08-11 VITALS — BP 124/60 | HR 76 | Resp 16 | Ht 64.0 in | Wt 230.0 lb

## 2016-08-11 DIAGNOSIS — G932 Benign intracranial hypertension: Secondary | ICD-10-CM | POA: Diagnosis not present

## 2016-08-11 DIAGNOSIS — R519 Headache, unspecified: Secondary | ICD-10-CM

## 2016-08-11 DIAGNOSIS — H538 Other visual disturbances: Secondary | ICD-10-CM | POA: Diagnosis not present

## 2016-08-11 DIAGNOSIS — R51 Headache: Secondary | ICD-10-CM

## 2016-08-11 MED ORDER — ACETAZOLAMIDE ER 500 MG PO CP12: 500.0000 mg | ORAL_CAPSULE | Freq: Two times a day (BID) | ORAL | 5 refills | Status: AC

## 2016-08-11 NOTE — Progress Notes (Signed)
Subjective:    Patient ID: April Lynch is a 30 y.o. female.  HPI     Interim history:   Ms. 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 The patient is unaccompanied today. I first met her on 04/20/2016 at the request of her ophthalmologist, at which time she reported a recent diagnosis of pseudotumor cerebri about a month prior and she was on Diamox, which we continued. She also reported morning headaches and snoring is as well as excessive daytime somnolence. I suggested she return for sleep study. She had a baseline sleep study on 06/21/2016. I went over her test results with her in detail today. Sleep efficiency was 84.9% with a latency to sleep of 37.5 minutes and wake after sleep onset of 24.5 minutes with mild sleep fragmentation noted. She had a normal arousal index. She had an increased percentage of stage II sleep, a normal percentage of REM sleep at 20.3% and a decreased percentage of slow-wave sleep at 6%, REM latency was mildly prolonged at 141.5 minutes. She had no significant PLMS, EKG or EEG changes. She had mild to at times moderate snoring. Total AHI was normal at 0.7 per hour. Average oxygen saturation was 97%, nadir was 94%.   Today, 08/11/2016: She reports that her HAs are worse and feels that her vision is worse as well, in that Blurry vision, both eyes. She continues to work on her weight loss. She uses Diamox twice daily and needs a refill today. She has lost about 13 lb since April 2017. She has an appointment with Dr. Frederico Hamman on 08/27/16. We did a head CT with and without contrast which was unremarkable. We could not pursue MRI brain and MRV at the time as she could not remove her  piercings but since then she has been able to remove her piercings and would like to proceed with MRI and MRV testing as planned previously.  Previously:  04/20/2016: She report recurrent headaches. She had evidence of papilledema bilaterally. I reviewed your office note from 04/13/2016, which you kindly included. According to your notes, she had a lumbar puncture recently about 4 weeks ago which showed an elevated opening pressure of 30. I reviewed, puncture note from 04/07/2016 which showed an opening pressure of 30 cm and a closing pressure of 18 cm. She has been started on Diamox, currently 500 mg twice daily. She seems to tolerate this well. Visual acuity corrected is 20/40 on the right and 20/20 on the left, she had no obvious visual field deficits. Recent eye exam showed no edema of the optic disks. Of note, she snores and reports excessive daytime somnolence and morning headaches.  She lives alone, but family has reported snoring to her.  Of note, she had a CTH w/wo contrast on 08/07/14: IMPRESSION: Normal CT appearance of the brain.   She is trying to lose weight and has indeed lost about 17 lb in the last 2 months.   She has a FHx of OSA in her father, who uses a CPAP machine.  She has improved in her HAs, and has occasional morning. She has occasional nocturia.  She goes to bed around 8:30 to 9 PM.  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 She does not typically wake up rested. She feels tired during the day. She does not typically drink caffeine. She drinks alcohol very occasionally, maybe once a month, she does not smoke cigarettes. She lives alone and is single. She does not have any children. She denies restless leg symptoms. She denies nocturia on a nightly basis. She had wisdom tooth removal today. Has not taken pain medication.  She has been able to tolerate the Diamox, currently 500 mg bid.     Her Past Medical History Is Significant For: Past  Medical History:  Diagnosis Date  . Asthma   . Hypertension     Her Past Surgical History Is Significant For: No past surgical history on file.  Her Family History Is Significant For: Family History  Problem Relation Age of Onset  . Heart murmur Mother   . Diabetes Father   . Gout Father     Her Social History Is Significant For: Social History   Social History  . Marital status: Single    Spouse name: N/A  . Number of children: 0  . Years of education: BA   Occupational History  . [employer]    Social History Main Topics  . Smoking status: Never Smoker  . Smokeless tobacco: None  . Alcohol use 0.0 oz/week     Comment: occasionally  . Drug use: No  . Sexual activity: Yes    Birth control/ protection: Pill   Other Topics Concern  . None   Social History Narrative   Rare caffeine use     Her Allergies Are:  Allergies  Allergen Reactions  . Prednisone Hives and Swelling  :   Her Current Medications Are:  Outpatient Encounter Prescriptions as of 08/11/2016  Medication Sig  . acetaZOLAMIDE (DIAMOX) 500 MG capsule Take 500 mg by mouth 2 (two) times daily.  Marland Kitchen albuterol (PROVENTIL HFA;VENTOLIN HFA) 108 (90 BASE) MCG/ACT inhaler Inhale 2 puffs into the lungs every 6 (six) hours as needed.  . Ethynodiol Diac-Eth Estradiol (ZOVIA 1/50E, 28, PO) Take by mouth.  Marland Kitchen HYDROcodone-acetaminophen (NORCO/VICODIN) 5-325 MG tablet Take 1-2 tablets by mouth every 4 (four) hours as needed.  Marland Kitchen lisinopril-hydrochlorothiazide (PRINZIDE,ZESTORETIC) 20-25 MG tablet Take 1 tablet by mouth daily.  Marland Kitchen loratadine (CLARITIN) 10 MG tablet Take by mouth.  . [DISCONTINUED] ciprofloxacin (CIPRO) 500 MG tablet Take 1 tablet (500 mg total) by mouth 2 (two) times daily.  . [DISCONTINUED] metroNIDAZOLE (FLAGYL) 500 MG tablet Take 1 tablet (500 mg total) by mouth 2 (two) times daily.   No facility-administered encounter medications on file as of 08/11/2016.   :  Review of Systems:  Out of a complete 14  point review of systems, all are reviewed and negative with the exception of these symptoms as listed below:  Review of Systems  Neurological:       Patient is here to discuss sleep study and headaches.  Patient states that her headaches are getting worse and she feels that her vision is worse.     Objective:  Neurologic Exam  Physical Exam Physical Examination:   Vitals:   08/11/16 1512  BP: 124/60  Pulse: 76  Resp: 16   General Examination: The patient is a very pleasant 30 y.o. female in no acute distress. She appears well-developed and well-nourished and well groomed. She is obese and has lost weight.   HEENT: Normocephalic, atraumatic, pupils are equal, round and reactive to light and accommodation. Funduscopic exam shows possible papilledema, right more than  left, on visual field testing with finger perimetry she has normal visual fields by confrontation. Extraocular tracking is good without limitation to gaze excursion or nystagmus noted. Normal smooth pursuit is noted. Hearing is grossly intact. Face is symmetric with normal facial animation and normal facial sensation. Speech is clear with no dysarthria noted. There is no hypophonia. There is no lip, neck/head, jaw or voice tremor. Neck is supple with full range of passive and active motion. There are no carotid bruits on auscultation. Oropharynx exam reveals: mild mouth dryness, adequate dental hygiene and moderate airway crowding, due to smaller airway entry, tonsils of 2+, larger appearing uvula. Mallampati is class II. Tongue protrudes centrally and palate elevates symmetrically.    Chest: Clear to auscultation without wheezing, rhonchi or crackles noted.  Heart: S1+S2+0, regular and normal without murmurs, rubs or gallops noted.   Abdomen: Soft, non-tender and non-distended with normal bowel sounds appreciated on auscultation.  Extremities: There is no pitting edema in the distal lower extremities bilaterally. Pedal pulses  are intact.  Skin: Warm and dry without trophic changes noted.  Musculoskeletal: exam reveals no obvious joint deformities, tenderness or joint swelling or erythema.   Neurologically:  Mental status: The patient is awake, alert and oriented in all 4 spheres. Her immediate and remote memory, attention, language skills and fund of knowledge are appropriate. There is no evidence of aphasia, agnosia, apraxia or anomia. Speech is clear with normal prosody and enunciation. Thought process is linear. Mood is normal and affect is normal.  Cranial nerves II - XII are as described above under HEENT exam. In addition: shoulder shrug is normal with equal shoulder height noted. Motor exam: Normal bulk, strength and tone is noted. There is no drift, tremor or rebound. Romberg is negative. Reflexes are 2+ throughout. Babinski: Toes are flexor bilaterally. Fine motor skills and coordination: intact with normal finger taps, normal hand movements, normal rapid alternating patting, normal foot taps and normal foot agility.  Cerebellar testing: No dysmetria or intention tremor on finger to nose testing. Heel to shin is unremarkable bilaterally. There is no truncal or gait ataxia.  Sensory exam: intact to light touch in the upper and lower extremities.  Gait, station and balance: She stands easily. No veering to one side is noted. No leaning to one side is noted. Posture is age-appropriate and stance is narrow based. Gait shows normal stride length and normal pace. No problems turning are noted. Tandem walk is slightly difficult for her, unchanged.                Assessment and Plan:  In summary, Brenlyn Beshara is a very pleasant 30 year old female with an underlying medical history of morbid obesity, vitamin D deficiency, hypertension, uterine leiomyoma, and astigmatism, whoPresents for follow-up consultation of her pseudotumor cerebri. She had a lumbar puncture in April 2017 which showed an opening pressure of 30 cm.  She has been on Diamox, currently still at 500 mg twice daily, we may increase this after her eye exam depending on the results. On my exam she may indeed have papilledema bilaterally, right worse than left but I would like to wait out her formal eye exam with Dr. Frederico Hamman later this month. For now, I renewed her prescription for Diamox 500 mg twice a day. Sleep study thankfully was negative for sleep apnea and showed benign results. Head CT was negative as well, we will proceed with brain MRI without contrast and MRV with and without contrast, I renewed the order  today. She has been able to lose weight. She is advised continue to work on weight loss. We will call her with her MRI and MRV results. She is advised to call me after she has had her eye exam for an update.  I will see her back routinely in 3 months, sooner as needed. I answered all her questions today and she was in agreement. I spent 25 minutes in total face-to-face time with the patient, more than 50% of which was spent in counseling and coordination of care, reviewing test results, reviewing medication and discussing or reviewing the diagnosis of PTC, its prognosis and treatment options.

## 2016-08-11 NOTE — Patient Instructions (Addendum)
We will do the brain MRI and MRV as previously planned and will call you with the results.  Continue for now with the diamox 500 mg twice daily. Let me know, what Dr. Frederico Hamman says at your next appointment this month.  Your sleep study was benign, no sleep apnea.

## 2016-08-21 ENCOUNTER — Telehealth: Payer: Self-pay | Admitting: Neurology

## 2016-08-21 DIAGNOSIS — G932 Benign intracranial hypertension: Secondary | ICD-10-CM

## 2016-08-21 NOTE — Telephone Encounter (Signed)
MRV head reordered wo contrast, can you route to the appropriate party? thx

## 2016-08-21 NOTE — Telephone Encounter (Signed)
Tabby/GI [phone] called to advise that MRV Head must always be with out. Please send change the order. The appt has not been scheduled yet Thank you

## 2016-08-25 NOTE — Telephone Encounter (Signed)
Per Andee Poles, patient has been left a message to schedule but patient has not called back

## 2016-08-27 ENCOUNTER — Encounter: Payer: Self-pay | Admitting: Neurology

## 2016-09-08 ENCOUNTER — Ambulatory Visit
Admission: RE | Admit: 2016-09-08 | Discharge: 2016-09-08 | Disposition: A | Discharge status: Home / Self Care | Payer: PRIVATE HEALTH INSURANCE | Source: Ambulatory Visit | Attending: Neurology | Admitting: Neurology

## 2016-09-08 DIAGNOSIS — G932 Benign intracranial hypertension: Secondary | ICD-10-CM

## 2016-09-08 DIAGNOSIS — H538 Other visual disturbances: Secondary | ICD-10-CM

## 2016-09-08 DIAGNOSIS — R51 Headache: Secondary | ICD-10-CM

## 2016-09-08 DIAGNOSIS — R519 Headache, unspecified: Secondary | ICD-10-CM

## 2016-09-08 MED ORDER — GADOBENATE DIMEGLUMINE 529 MG/ML IV SOLN
20.0000 mL | Freq: Once | INTRAVENOUS | Status: AC | PRN
  Administered 2016-09-08: 20 mL via INTRAVENOUS

## 2016-09-14 ENCOUNTER — Telehealth: Payer: Self-pay

## 2016-09-14 NOTE — Telephone Encounter (Signed)
-----   Message from Star Age, MD sent at 09/14/2016  8:49 AM EDT ----- MRI shows some changes that we can see in patients with pseudotumor, such as enlarged optic sheaths, due to increase in fluid pressure, normal brain otherwise, no action required. Also, normal MR venogram.  Will continue to treat for pseudotumor and Headaches, FU as scheduled, pls notify patient.  Star Age, MD, PhD [facility])

## 2016-09-14 NOTE — Telephone Encounter (Signed)
I spoke to patient and she is aware of results and recommendations.  

## 2016-09-14 NOTE — Progress Notes (Signed)
MRI shows some changes that we can see in patients with pseudotumor, such as enlarged optic sheaths, due to increase in fluid pressure, normal brain otherwise, no action required. Also, normal MR venogram.  Will continue to treat for pseudotumor and Headaches, FU as scheduled, pls notify patient.  Star Age, MD, PhD [facility])

## 2016-10-13 ENCOUNTER — Encounter: Payer: Self-pay | Admitting: Neurology

## 2016-11-16 ENCOUNTER — Ambulatory Visit: Payer: PRIVATE HEALTH INSURANCE | Admitting: Neurology

## 2016-11-17 IMAGING — CT CT ABD-PELV W/ CM
2 of 4 series · 16 of 46 positions shown, 18 images · IV contrast (APPLIED)
Comparison: Pelvic ultrasound 05/13/2016

CLINICAL DATA: Right and left lower quadrant pain for 2 days.
Nausea and vomiting.

EXAM:
CT ABDOMEN AND PELVIS WITH CONTRAST
TECHNIQUE: Multidetector CT imaging of the abdomen and pelvis was performed
using the standard protocol following bolus administration of
intravenous contrast.
CONTRAST:  100mL RWFQCE-NZZ IOPAMIDOL (RWFQCE-NZZ) INJECTION 61%

[Series 2: axial st · axial · 0.93mm/px · z∈[-473,-28]mm · 13 of 99 slices shown, 15 images]
[im 5/99  soft-tissue]
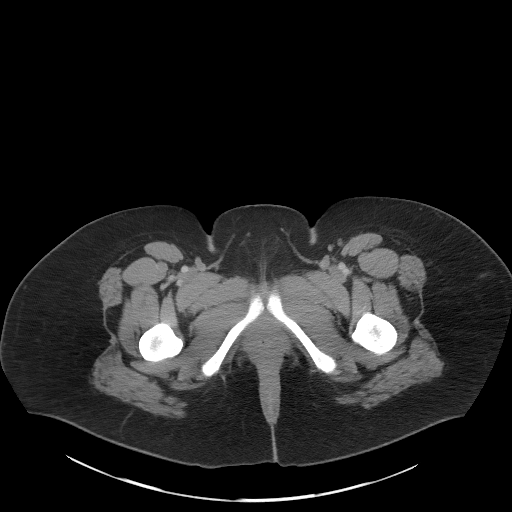
[im 5/99  bone]
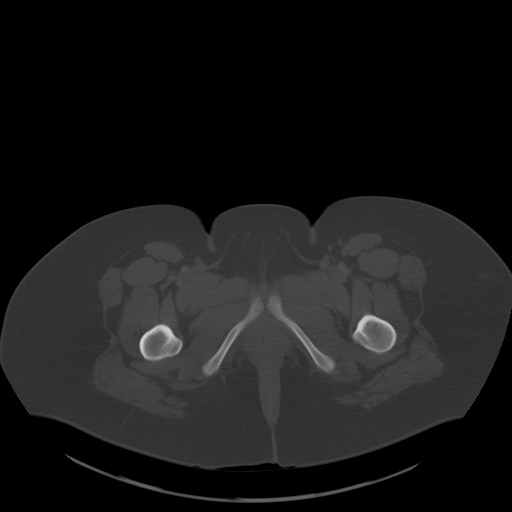
[im 13/99  soft-tissue]
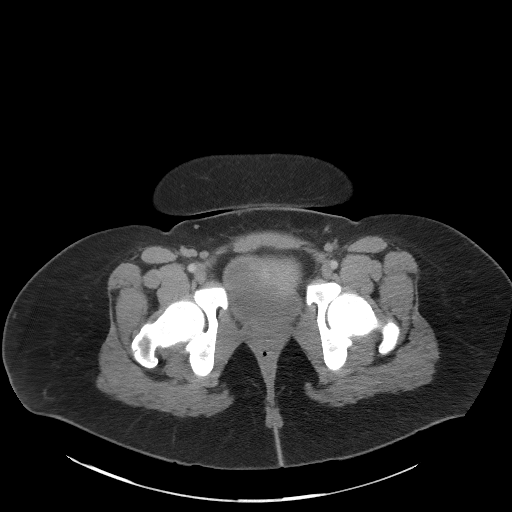
[im 21/99  soft-tissue]
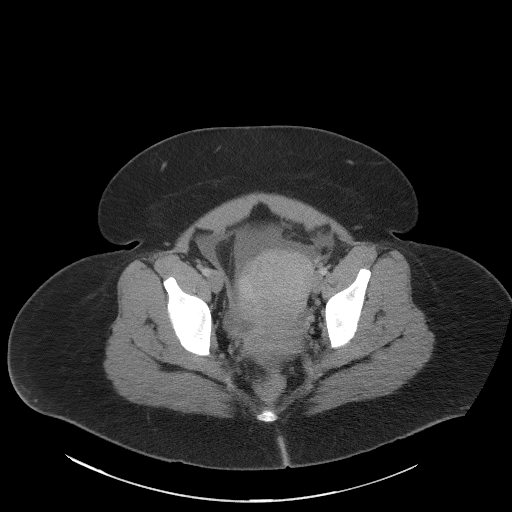
[im 29/99  soft-tissue]
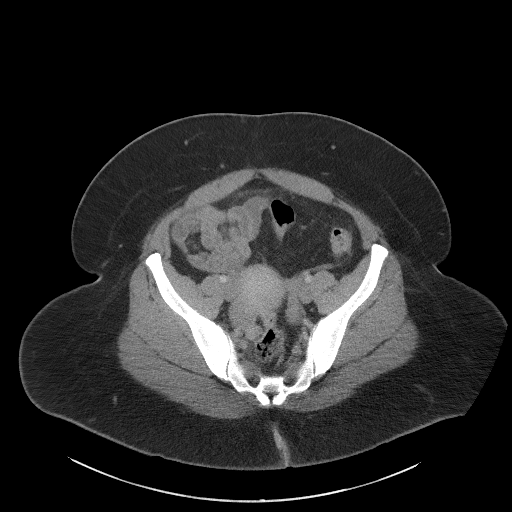
[im 33/99  soft-tissue]
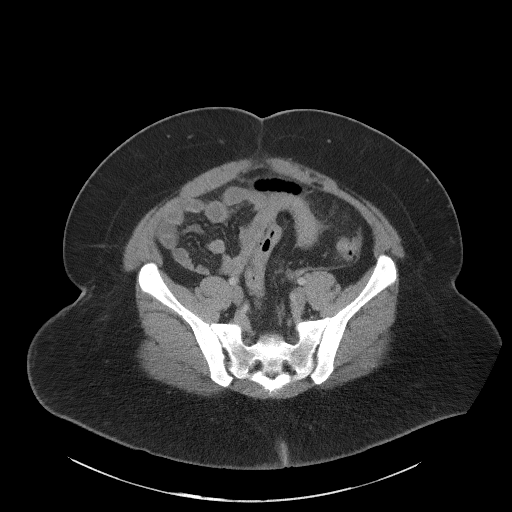
[im 41/99  soft-tissue]
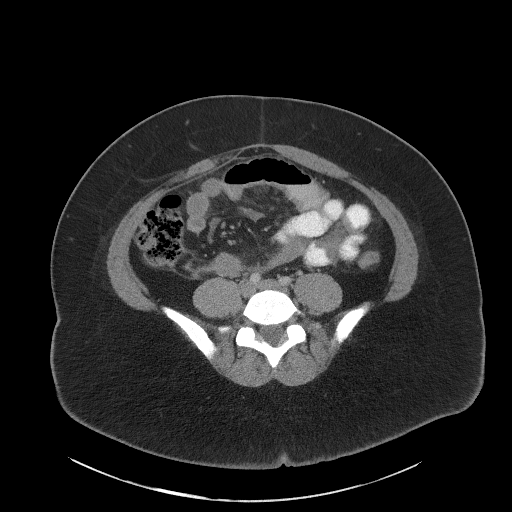
[im 50/99  soft-tissue]
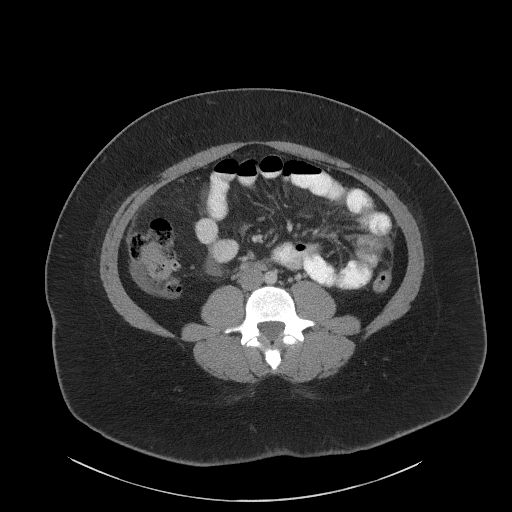
[im 58/99  soft-tissue]
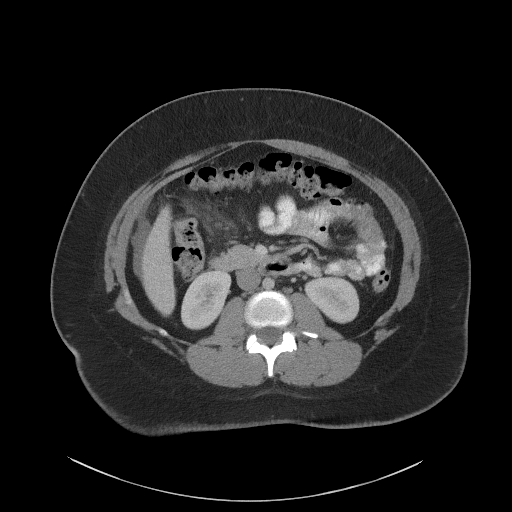
[im 66/99  soft-tissue]
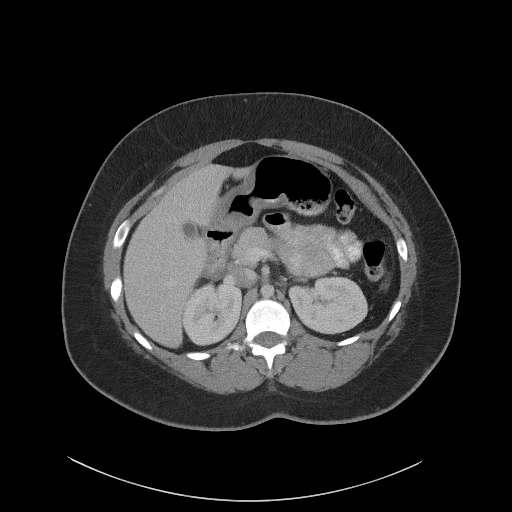
[im 66/99  bone]
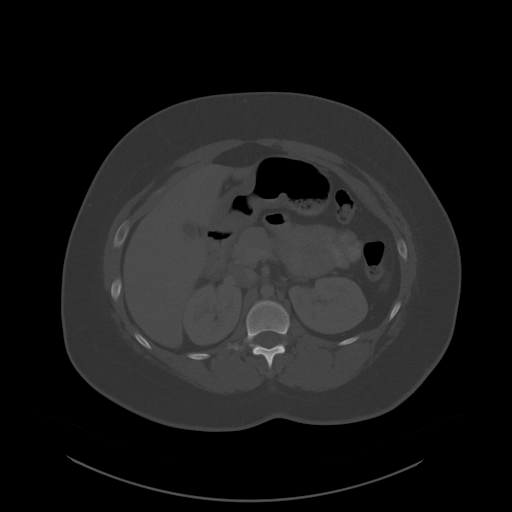
[im 70/99  soft-tissue]
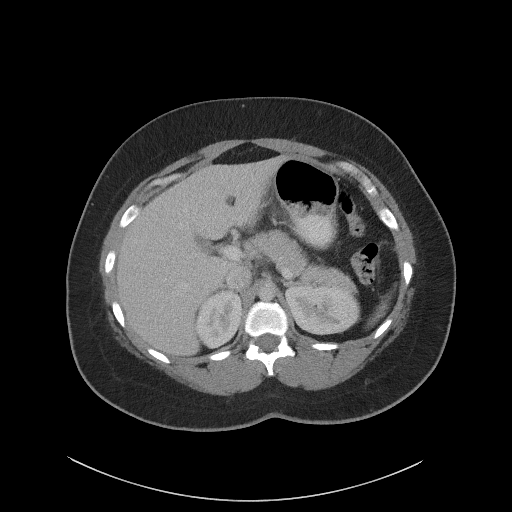
[im 78/99  soft-tissue]
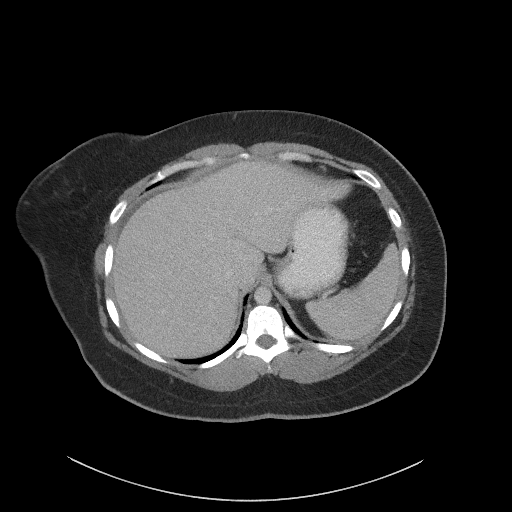
[im 86/99  soft-tissue]
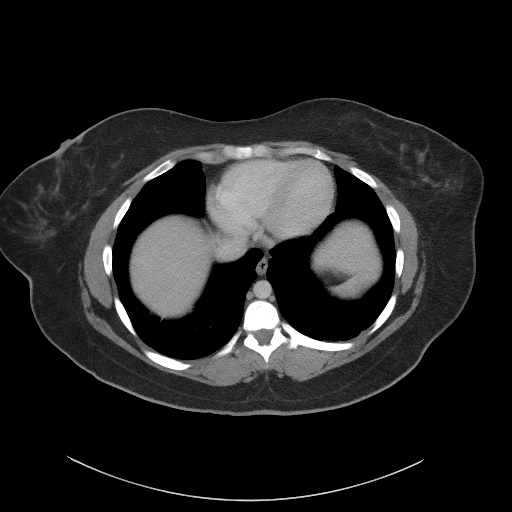
[im 94/99  soft-tissue]
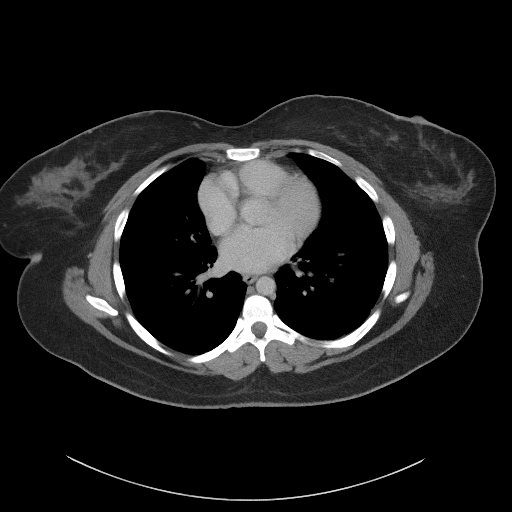

[Series 5: coronal st · coronal · 0.78mm/px · 3 of 82 slices shown]
[im 28/82  soft-tissue]
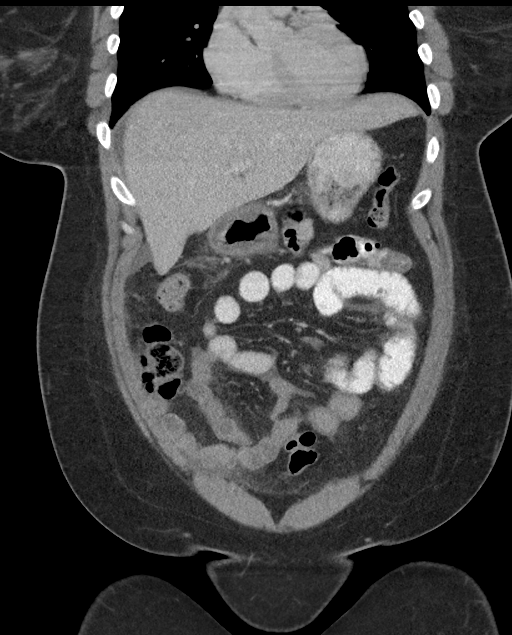
[im 37/82  soft-tissue]
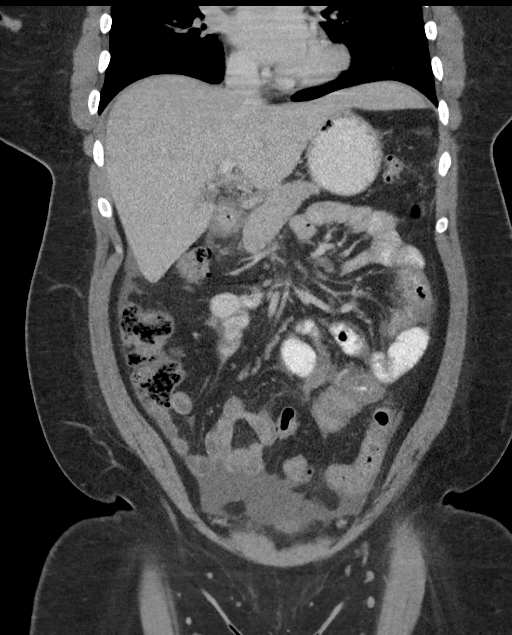
[im 46/82  soft-tissue]
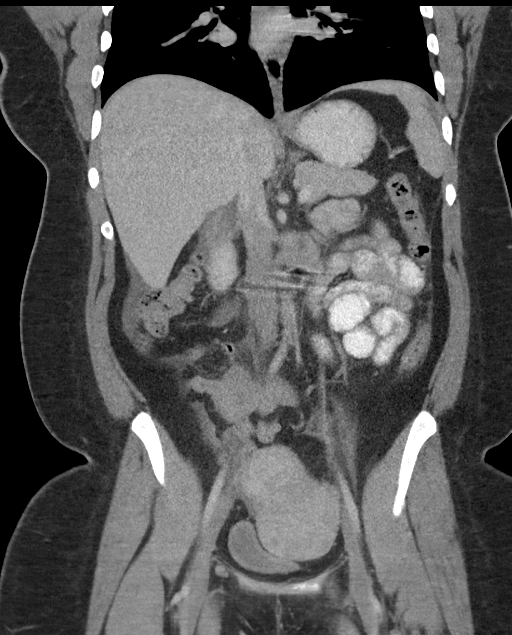

[16 of 46 positions shown; findings below may reference images not displayed]

FINDINGS: Lower chest:  The included lung bases are clear.

Liver: No focal lesion.

Hepatobiliary: Gallbladder is decompressed.  No calcified stone.

Pancreas: No ductal dilatation or inflammation.

Spleen: Normal.

Adrenal glands: No nodule.

Kidneys: Symmetric renal enhancement.  No hydronephrosis.

Stomach/Bowel: The stomach is physiologically distended. There is
gastric wall thickening in the antrum with adjacent perigastric
edema. There multifocal areas of small bowel wall thickening,
primarily in the left abdomen, with the segments of intervening
normal small bowel. Small bowel wall thickening also with associated
perienteric edema. There is no evidence of obstruction or bowel
dilatation. No terminal ileum abnormality. Descending colon is
decompressed, equivocal wall thickening in the midportion no
additional colonic wall thickening. The appendix is air-filled and
normal.

Vascular/Lymphatic: Small aortic caval lymph nodes. There are small
scattered mesenteric lymph nodes. Abdominal aorta is normal in
caliber.

Reproductive: Enlarged heterogeneous uterus consistent with
fibroids. Ovaries versus symmetric and normal in size.

Bladder: Decompressed.

Other: There is moderate volume of intra-abdominal free fluid a
right pericolic gutter, perihepatic, scattered throughout the
mesentery adjacent to small bowel thickening, and tracking in the
pelvis. This appears mildly complex. There is no free air. No
well-defined abscess.

Musculoskeletal: There are no acute or suspicious osseous
abnormalities.
IMPRESSION: 1. Multi focal bowel inflammation involving the stomach, small
bowel, and possibly an ascending colon. Findings are suspicious for
Crohn's disease. There is associated free fluid in the abdomen and
pelvis that appears mildly complex. No free air. Infectious etiology
is also considered but felt less likely.
2. Normal appendix.
3. Uterine fibroids.
